# Patient Record
Sex: Female | Born: 1995 | Race: Black or African American | Hispanic: No | Marital: Married | Smoking: Never smoker
Health system: Southern US, Community
[De-identification: ages and names within clinical notes are randomized; demographics above are authoritative.]

## PROBLEM LIST (undated history)

## (undated) DIAGNOSIS — O1490 Unspecified pre-eclampsia, unspecified trimester: Secondary | ICD-10-CM

## (undated) DIAGNOSIS — Z789 Other specified health status: Secondary | ICD-10-CM

## (undated) HISTORY — DX: Other specified health status: Z78.9

## (undated) HISTORY — DX: Unspecified pre-eclampsia, unspecified trimester: O14.90

## (undated) HISTORY — PX: FINGER SURGERY: SHX640

---
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
## 2019-01-24 ENCOUNTER — Other Ambulatory Visit: Payer: Self-pay

## 2019-01-24 ENCOUNTER — Emergency Department
Admission: EM | Admit: 2019-01-24 | Discharge: 2019-01-25 | Disposition: A | Discharge status: Home / Self Care | Payer: BLUE CROSS/BLUE SHIELD | Attending: Emergency Medicine | Admitting: Emergency Medicine

## 2019-01-24 DIAGNOSIS — G43809 Other migraine, not intractable, without status migrainosus: Secondary | ICD-10-CM | POA: Insufficient documentation

## 2019-01-24 NOTE — ED Triage Notes (Signed)
Patient reports having migraine since yesterday evening.

## 2019-01-24 NOTE — ED Provider Notes (Signed)
[facility] Emergency Department Provider Note   ____________________________________________   First MD Initiated Contact with Patient 01/24/19 2339     (approximate)  I have reviewed the triage vital signs and the nursing notes.   HISTORY  Chief Complaint Migraine    HPI Natalie May is a 23 y.o. female who presents to the ED from home with a chief complaint of migraine headache.  Patient has a history of migraine headaches but has not seen a neurologist since she became an adult.  Complains of left-sided headache to her temple with waxing/waning numbness to her hands.  Symptoms associated with nausea and vomiting.  Reports symptoms are typical of migraine headaches.  Denies recent fever, cough, chest pain, shortness of breath, abdominal pain, diarrhea.  Denies recent travel, trauma or exposure to persons diagnosed with coronavirus.        Past medical history Migraines  There are no active problems to display for this patient.   Prior to Admission medications   Medication Sig Start Date End Date Taking? Authorizing Provider  prochlorperazine (COMPAZINE) 10 MG tablet Take 1 tablet (10 mg total) by mouth every 6 (six) hours as needed for nausea (headache). 01/25/19   Irean HongSung, Jade J, MD    Allergies Patient has no known allergies.  No family history on file.  Social History Social History   Tobacco Use  . Smoking status: Not on file  Substance Use Topics  . Alcohol use: Not on file  . Drug use: Not on file    Review of Systems  Constitutional: No fever/chills Eyes: No visual changes. ENT: No sore throat. Cardiovascular: Denies chest pain. Respiratory: Denies shortness of breath. Gastrointestinal: No abdominal pain.  No nausea, no vomiting.  No diarrhea.  No constipation. Genitourinary: Negative for dysuria. Musculoskeletal: Negative for back pain. Skin: Negative for rash. Neurological: Positive for headache and hand numbness.  Negative for focal weakness.   ____________________________________________   PHYSICAL EXAM:  VITAL SIGNS: ED Triage Vitals  Enc Vitals Group     BP 01/24/19 2335 121/72     Pulse Rate 01/24/19 2335 74     Resp 01/24/19 2335 18     Temp 01/24/19 2335 98.3 F (36.8 C)     Temp Source 01/24/19 2335 Oral     SpO2 01/24/19 2335 97 %     Weight 01/24/19 2333 270 lb (122.5 kg)     Height 01/24/19 2333 5\' 6"  (1.676 m)     Head Circumference --      Peak Flow --      Pain Score 01/24/19 2332 6     Pain Loc --      Pain Edu? --      Excl. in GC? --     Constitutional: Alert and oriented. Well appearing and in mild acute distress. Eyes: Conjunctivae are normal. PERRL. EOMI. Head: Atraumatic. Nose: No congestion/rhinnorhea. Mouth/Throat: Mucous membranes are moist.  Oropharynx non-erythematous. Neck: No stridor.  No carotid bruits.  Supple neck without meningismus. Cardiovascular: Normal rate, regular rhythm. Grossly normal heart sounds.  Good peripheral circulation. Respiratory: Normal respiratory effort.  No retractions. Lungs CTAB. Gastrointestinal: Soft and nontender. No distention. No abdominal bruits. No CVA tenderness. Musculoskeletal: No lower extremity tenderness nor edema.  No joint effusions. Neurologic: Alert and oriented x3.  CN II-XII grossly intact. Normal speech and language. No gross focal neurologic deficits are appreciated. No gait instability. Skin:  Skin is warm, dry and intact. No rash noted.  No petechiae.  Psychiatric: Mood and affect are normal. Speech and behavior are normal.  ____________________________________________   LABS (all labs ordered are listed, but only abnormal results are displayed)  Labs Reviewed - No data to display ____________________________________________  EKG  None ____________________________________________  RADIOLOGY  ED MD interpretation: No ICH  Official radiology report(s): Ct Head Wo Contrast  Result Date:  01/25/2019 CLINICAL DATA:  Headaches EXAM: CT HEAD WITHOUT CONTRAST TECHNIQUE: Contiguous axial images were obtained from the base of the skull through the vertex without intravenous contrast. COMPARISON:  None. FINDINGS: Brain: No evidence of acute infarction, hemorrhage, hydrocephalus, extra-axial collection or mass lesion/mass effect. Vascular: No hyperdense vessel or unexpected calcification. Skull: Normal. Negative for fracture or focal lesion. Sinuses/Orbits: No acute finding. Other: None. IMPRESSION: No acute intracranial abnormality noted. Electronically Signed   By: Alcide Clever M.D.   On: 01/25/2019 00:55    ____________________________________________   PROCEDURES  Procedure(s) performed (including Critical Care):  Procedures   ____________________________________________   INITIAL IMPRESSION / ASSESSMENT AND PLAN / ED COURSE  As part of my medical decision making, I reviewed the following data within the electronic MEDICAL RECORD NUMBER Nursing notes reviewed and incorporated, Old chart reviewed, Radiograph reviewed and Notes from prior ED visits        23 year old female who presents with recurrent migraine headache. Differential diagnosis includes, but is not limited to, intracranial hemorrhage, meningitis/encephalitis, previous head trauma, cavernous venous thrombosis, tension headache, temporal arteritis, migraine or migraine equivalent, idiopathic intracranial hypertension, and non-specific headache.  Natalie May was evaluated in Emergency Department on 01/25/2019 for the symptoms described in the history of present illness. She was evaluated in the context of the global COVID-19 pandemic, which necessitated consideration that the patient might be at risk for infection with the SARS-CoV-2 virus that causes COVID-19. Institutional protocols and algorithms that pertain to the evaluation of patients at risk for COVID-19 are in a state of rapid change based on information released  by regulatory bodies including the CDC and federal and state organizations. These policies and algorithms were followed during the patient's care in the ED.  Patient is well-appearing with supple neck and no focal neurological deficits.  Requesting CT head as it has been a number of years since she has had one.  We discussed risk/benefits and she would still like to proceed for reassurance.  Will administer IV Toradol, Compazine, Benadryl and IV fluids and reassess.   Clinical Course as of Jan 25 515  Sun Jan 25, 2019  0131 Updated patient on CT result.  She is feeling significantly better.  Will discharge after completion of IV fluids.  Strict return precautions given.  Patient verbalizes understanding agrees with plan of care.   [JS]    Clinical Course User Index [JS] Irean Hong, MD     ____________________________________________   FINAL CLINICAL IMPRESSION(S) / ED DIAGNOSES  Final diagnoses:  Other migraine without status migrainosus, not intractable     ED Discharge Orders         Ordered    prochlorperazine (COMPAZINE) 10 MG tablet  Every 6 hours PRN     01/25/19 0133           Note:  This document was prepared using Dragon voice recognition software and may include unintentional dictation errors.   Irean Hong, MD 01/25/19 [phone]

## 2019-01-25 ENCOUNTER — Emergency Department: Payer: BLUE CROSS/BLUE SHIELD

## 2019-01-25 MED ORDER — PROCHLORPERAZINE MALEATE 10 MG PO TABS: 10.0000 mg | ORAL_TABLET | Freq: Four times a day (QID) | ORAL | 0 refills | Status: DC | PRN

## 2019-01-25 MED ORDER — DEXTROSE-NACL 5-0.45 % IV SOLN
Freq: Once | INTRAVENOUS | Status: AC
  Administered 2019-01-25: 01:00:00 via INTRAVENOUS

## 2019-01-25 MED ORDER — PROCHLORPERAZINE EDISYLATE 10 MG/2ML IJ SOLN
5.0000 mg | Freq: Once | INTRAMUSCULAR | Status: AC
  Administered 2019-01-25: 5 mg via INTRAVENOUS
  Filled 2019-01-25: qty 2

## 2019-01-25 MED ORDER — KETOROLAC TROMETHAMINE 30 MG/ML IJ SOLN
15.0000 mg | Freq: Once | INTRAMUSCULAR | Status: AC
  Administered 2019-01-25: 01:00:00 15 mg via INTRAVENOUS
  Filled 2019-01-25: qty 1

## 2019-01-25 MED ORDER — DIPHENHYDRAMINE HCL 50 MG/ML IJ SOLN
25.0000 mg | Freq: Once | INTRAMUSCULAR | Status: AC
  Administered 2019-01-25: 25 mg via INTRAVENOUS
  Filled 2019-01-25: qty 1

## 2019-01-25 NOTE — Discharge Instructions (Signed)
Take Compazine (#20) as needed for headache and nausea. Return to the ER for worsening symptoms, persistent vomiting, difficulty breathing or other concerns.

## 2019-01-25 NOTE — ED Notes (Signed)
Reviewed discharge instructions, follow-up care, and prescriptions with patient. Patient verbalized understanding of all information reviewed. Patient stable, with no distress noted at this time.    

## 2019-01-25 NOTE — ED Notes (Signed)
Patient c/o migraine headache beginning yesterday. Patient reports hx of complex migraines; reports seeing neurologist as a child for same. Patient has tried OTC medications with no relief. Patient reports current nausea, and 3 earlier emeses. Patient c/o light sensitivity and intermittent visual disturbance in the right eye.

## 2021-04-10 ENCOUNTER — Emergency Department
Admission: EM | Admit: 2021-04-10 | Discharge: 2021-04-10 | Disposition: A | Discharge status: Home / Self Care | Payer: 59 | Attending: Emergency Medicine | Admitting: Emergency Medicine

## 2021-04-10 ENCOUNTER — Other Ambulatory Visit: Payer: Self-pay

## 2021-04-10 DIAGNOSIS — U071 COVID-19: Secondary | ICD-10-CM | POA: Insufficient documentation

## 2021-04-10 DIAGNOSIS — R519 Headache, unspecified: Secondary | ICD-10-CM | POA: Diagnosis present

## 2021-04-10 LAB — RESP PANEL BY RT-PCR (FLU A&B, COVID) ARPGX2
Influenza A by PCR: NEGATIVE
Influenza B by PCR: NEGATIVE
SARS Coronavirus 2 by RT PCR: POSITIVE — AB

## 2021-04-10 MED ORDER — ONDANSETRON 4 MG PO TBDP
4.0000 mg | ORAL_TABLET | Freq: Once | ORAL | Status: AC
  Administered 2021-04-10: 4 mg via ORAL
  Filled 2021-04-10: qty 1

## 2021-04-10 MED ORDER — ONDANSETRON 4 MG PO TBDP: 4.0000 mg | ORAL_TABLET | Freq: Three times a day (TID) | ORAL | 0 refills | Status: DC | PRN

## 2021-04-10 MED ORDER — NIRMATRELVIR/RITONAVIR (PAXLOVID)TABLET: 3.0000 | ORAL_TABLET | Freq: Two times a day (BID) | ORAL | 0 refills | Status: AC

## 2021-04-10 MED ORDER — IBUPROFEN 600 MG PO TABS
600.0000 mg | ORAL_TABLET | Freq: Once | ORAL | Status: AC
  Administered 2021-04-10: 600 mg via ORAL
  Filled 2021-04-10: qty 1

## 2021-04-10 NOTE — ED Provider Notes (Signed)
[facility] Emergency Department Provider Note  ____________________________________________  Time seen: Approximately 6:42 AM  I have reviewed the triage vital signs and the nursing notes.   HISTORY  Chief Complaint Headache   HPI Natalie May is a 25 y.o. female with history of obesity who presents for evaluation of headache and body aches.  Patient reports that her symptoms started today.  She is complaining of generalized body aches, chills, headache and nausea.  Denies cough, chest pain, shortness of breath, vomiting, diarrhea.  Patient reports that she is vaccinated and has a booster for COVID.  Denies any known exposures.  PMH None - reviewed   Prior to Admission medications   Medication Sig Start Date End Date Taking? Authorizing Provider  nirmatrelvir/ritonavir EUA (PAXLOVID) TABS Take 3 tablets by mouth 2 (two) times daily for 5 days. Patient GFR is normal. Take nirmatrelvir (150 mg) two tablets twice daily for 5 days and ritonavir (100 mg) one tablet twice daily for 5 days. 04/10/21 04/15/21 Yes Esty Ahuja, [place], MD  ondansetron (ZOFRAN ODT) 4 MG disintegrating tablet Take 1 tablet (4 mg total) by mouth every 8 (eight) hours as needed. 04/10/21  Yes Don Perking, [place], MD  prochlorperazine (COMPAZINE) 10 MG tablet Take 1 tablet (10 mg total) by mouth every 6 (six) hours as needed for nausea (headache). 01/25/19   Irean Hong, MD    Allergies Patient has no known allergies.  No family history on file.  Social History    Review of Systems  Constitutional: Negative for fever. + chills, body aches Eyes: Negative for visual changes. ENT: Negative for sore throat. Neck: No neck pain  Cardiovascular: Negative for chest pain. Respiratory: Negative for shortness of breath. Gastrointestinal: Negative for abdominal pain, vomiting or diarrhea. + nausea Genitourinary: Negative for dysuria. Musculoskeletal: Negative for back pain. Skin: Negative  for rash. Neurological: Negative for weakness or numbness. + HA Psych: No SI or HI  ____________________________________________   PHYSICAL EXAM:  VITAL SIGNS: ED Triage Vitals  Enc Vitals Group     BP 04/10/21 0027 123/69     Pulse Rate 04/10/21 0027 (!) 106     Resp 04/10/21 0027 18     Temp 04/10/21 0027 99.7 F (37.6 C)     Temp Source 04/10/21 0027 Oral     SpO2 04/10/21 0027 100 %     Weight 04/10/21 0028 270 lb (122.5 kg)     Height 04/10/21 0028 5\' 6"  (1.676 m)     Head Circumference --      Peak Flow --      Pain Score 04/10/21 0028 7     Pain Loc --      Pain Edu? --      Excl. in GC? --     Constitutional: Alert and oriented. Well appearing and in no apparent distress. HEENT:      Head: Normocephalic and atraumatic.         Eyes: Conjunctivae are normal. Sclera is non-icteric.       Mouth/Throat: Mucous membranes are moist.       Neck: Supple with no signs of meningismus. Cardiovascular: Tachycardic with regular rhythm. Respiratory: Normal respiratory effort. Lungs are clear to auscultation bilaterally.  Gastrointestinal: Soft, non tender, and non distended with positive bowel sounds. No rebound or guarding. Genitourinary: No CVA tenderness. Musculoskeletal:  No edema, cyanosis, or erythema of extremities. Neurologic: Normal speech and language. Face is symmetric. Moving all extremities. No gross focal neurologic deficits are appreciated.  Skin: Skin is warm, dry and intact. No rash noted. Psychiatric: Mood and affect are normal. Speech and behavior are normal.  ____________________________________________   LABS (all labs ordered are listed, but only abnormal results are displayed)  Labs Reviewed  RESP PANEL BY RT-PCR (FLU A&B, COVID) ARPGX2 - Abnormal; Notable for the following components:      Result Value   SARS Coronavirus 2 by RT PCR POSITIVE (*)    All other components within normal limits    ____________________________________________  EKG  none  ____________________________________________  RADIOLOGY  none  ____________________________________________   PROCEDURES  Procedure(s) performed: None Procedures Critical Care performed:  None ____________________________________________   INITIAL IMPRESSION / ASSESSMENT AND PLAN / ED COURSE  25 y.o. female with history of obesity who presents for evaluation of headache, chills, body aches, and nausea.  Symptoms started this morning.  Patient is well-appearing in no distress, normal work of breathing and normal sats both in ambulation and at rest.  Has no chest pain or shortness of breath.  COVID positive.  Since patient has a history of obesity with a BMI of 44 we did discuss risks and benefits of Paxlovid.  Patient little hesitant in starting the medication but a prescription was given anyways.  She was also receiving a prescription of Zofran.  Discussed quarantine, immune system boosting, oxygen monitoring at home, follow-up with PCP, and my standard return precautions      _____________________________________________ Please note:  Patient was evaluated in Emergency Department today for the symptoms described in the history of present illness. Patient was evaluated in the context of the global COVID-19 pandemic, which necessitated consideration that the patient might be at risk for infection with the SARS-CoV-2 virus that causes COVID-19. Institutional protocols and algorithms that pertain to the evaluation of patients at risk for COVID-19 are in a state of rapid change based on information released by regulatory bodies including the CDC and federal and state organizations. These policies and algorithms were followed during the patient's care in the ED.  Some ED evaluations and interventions may be delayed as a result of limited staffing during the pandemic.   [place] Controlled Substance Database was reviewed by  me. ____________________________________________   FINAL CLINICAL IMPRESSION(S) / ED DIAGNOSES   Final diagnoses:  COVID-19      NEW MEDICATIONS STARTED DURING THIS VISIT:  ED Discharge Orders          Ordered    nirmatrelvir/ritonavir EUA (PAXLOVID) TABS  2 times daily        04/10/21 0641    ondansetron (ZOFRAN ODT) 4 MG disintegrating tablet  Every 8 hours PRN        04/10/21 0641             Note:  This document was prepared using Dragon voice recognition software and may include unintentional dictation errors.    Don Perking, [place], MD 04/10/21 [phone]

## 2021-04-10 NOTE — Discharge Instructions (Addendum)
To help boost your immune system against COVID-19, please take:  - Vitamin D3 4,000 IU/day - Vitamin C 500-1,000?mg twice a day - Quercetin 250?mg twice a day - Zinc 100?mg/day - Melatonin 10?mg before bedtime (causes drowsiness) - Aspirin 325?mg/day (unless contraindicated) - Pulse Oximeter Monitoring of oxygen saturation is recommended - check your oxygen 3 times a day if less than 90% return to the ER  These medications are all over-the-counter and do not need a prescription.  QUARANTINE INSTRUCTION  Follow these instructions at home:  Protecting others To avoid spreading the illness to other people: Quarantine in your home until you have had no cough and fever for 7 days. Household members should also be quarantine for at least 14 days after being exposed to you. Wash your hands often with soap and water. If soap and water are not available, use an alcohol-based hand sanitizer. If you have not cleaned your hands, do not touch your face. Make sure that all people in your household wash their hands well and often. Cover your nose and mouth when you cough or sneeze. Throw away used tissues. Stay home if you have any cold-like or flu-like symptoms. General instructions Go to your local pharmacy and buy a pulse oximeter (this is a machine that measures your oxygen). Check your oxygen levels at least 3 times a day. If your oxygen level is 90% or less return to the emergency room immediately Take over-the-counter and prescription medicines only as told by your health care provider. If you need medication for fever take tylenol or ibuprofen Drink enough fluid to keep your urine pale yellow. Rest at home as directed by your health care provider. Do not give aspirin to a child with the flu, because of the association with Reye's syndrome. Do not use tobacco products, including cigarettes, chewing tobacco, and e-cigarettes. If you need help quitting, ask your health care provider. Keep all  follow-up visits as told by your health care provider. This is important. How is this prevented? Avoid areas where an outbreak has been reported. Avoid large groups of people. Keep a safe distance from people who are coughing and sneezing. Do not touch your face if you have not cleaned your hands. When you are around people who are sick or might be sick, wear a mask to protect yourself. Contact a health care provider if: You have symptoms of SARS (cough, fever, chest pain, shortness of breath) that are not getting better at home. You have a fever. If you have difficulty breathing go to your local ER or call 911   

## 2021-04-10 NOTE — ED Triage Notes (Signed)
Pt states headache today with nausea. Pt states she took tylenol at 1900. Pt states she has had chills, no known covid exposure. Pt denies cough. Pt appears in no acute distress.

## 2021-06-21 ENCOUNTER — Encounter: Payer: Self-pay | Admitting: Advanced Practice Midwife

## 2021-06-21 ENCOUNTER — Other Ambulatory Visit (HOSPITAL_COMMUNITY)
Admission: RE | Admit: 2021-06-21 | Discharge: 2021-06-21 | Disposition: A | Discharge status: Home / Self Care | Payer: 59 | Source: Ambulatory Visit | Attending: Advanced Practice Midwife | Admitting: Advanced Practice Midwife

## 2021-06-21 ENCOUNTER — Ambulatory Visit (INDEPENDENT_AMBULATORY_CARE_PROVIDER_SITE_OTHER): Payer: 59 | Admitting: Advanced Practice Midwife

## 2021-06-21 ENCOUNTER — Other Ambulatory Visit: Payer: Self-pay

## 2021-06-21 VITALS — BP 120/82 | HR 91 | Ht 66.0 in | Wt 270.0 lb

## 2021-06-21 DIAGNOSIS — Z319 Encounter for procreative management, unspecified: Secondary | ICD-10-CM | POA: Diagnosis not present

## 2021-06-21 DIAGNOSIS — Z01419 Encounter for gynecological examination (general) (routine) without abnormal findings: Secondary | ICD-10-CM | POA: Insufficient documentation

## 2021-06-21 NOTE — Progress Notes (Signed)
Pt request STI testing.

## 2021-06-21 NOTE — Patient Instructions (Signed)
[facility] [address] [place] [phone]

## 2021-06-22 DIAGNOSIS — O99210 Obesity complicating pregnancy, unspecified trimester: Secondary | ICD-10-CM | POA: Insufficient documentation

## 2021-06-22 DIAGNOSIS — Z01419 Encounter for gynecological examination (general) (routine) without abnormal findings: Secondary | ICD-10-CM | POA: Insufficient documentation

## 2021-06-22 DIAGNOSIS — E669 Obesity, unspecified: Secondary | ICD-10-CM | POA: Insufficient documentation

## 2021-06-22 DIAGNOSIS — Z319 Encounter for procreative management, unspecified: Secondary | ICD-10-CM | POA: Insufficient documentation

## 2021-06-22 LAB — HEPATITIS C ANTIBODY: Hep C Virus Ab: <0.1 s/co ratio (ref 0.0–0.9)

## 2021-06-22 LAB — HIV ANTIBODY (ROUTINE TESTING W REFLEX): HIV Screen 4th Generation wRfx: NONREACTIVE

## 2021-06-22 LAB — RPR: RPR Ser Ql: NONREACTIVE

## 2021-06-22 LAB — HEPATITIS B SURFACE ANTIGEN: Hepatitis B Surface Ag: NEGATIVE

## 2021-06-22 NOTE — Progress Notes (Signed)
GYNECOLOGY ANNUAL PREVENTATIVE CARE ENCOUNTER NOTE  History:     Natalie May is a 25 y.o. G0P0000 female here for a routine annual gynecologic exam.  Current complaints: None.  Denies abnormal vaginal bleeding, discharge, pelvic pain, problems with intercourse or other gynecologic concerns.   Patient is accompanied by her partner RJ. They are planning pregnancy in the near future "not trying but not preventing" pregnancy at this time.   Gynecologic History Patient's last menstrual period was 05/26/2021. Contraception: none Last Pap: 2020. Result was normal with negative HPV Last Mammogram: N/A age 29  Obstetric History OB History  Gravida Para Term Preterm AB Living  0 0 0 0 0 0  SAB IAB Ectopic Multiple Live Births  0 0 0 0 0    History reviewed. No pertinent past medical history.  Past Surgical History:  Procedure Laterality Date   FINGER SURGERY      Current Outpatient Medications on File Prior to Visit  Medication Sig Dispense Refill   ondansetron (ZOFRAN ODT) 4 MG disintegrating tablet Take 1 tablet (4 mg total) by mouth every 8 (eight) hours as needed. (Patient not taking: Reported on 06/21/2021) 20 tablet 0   prochlorperazine (COMPAZINE) 10 MG tablet Take 1 tablet (10 mg total) by mouth every 6 (six) hours as needed for nausea (headache). (Patient not taking: Reported on 06/21/2021) 20 tablet 0   No current facility-administered medications on file prior to visit.    No Known Allergies  Social History:  reports that she has never smoked. She has never used smokeless tobacco. She reports current alcohol use. She reports that she does not use drugs.  Family History  Problem Relation Age of Onset   Diabetes Maternal Grandmother    Breast cancer Maternal Great-grandmother     The following portions of the patient's history were reviewed and updated as appropriate: allergies, current medications, past family history, past medical history, past social history,  past surgical history and problem list.  Review of Systems Pertinent items noted in HPI and remainder of comprehensive ROS otherwise negative.  Physical Exam:  BP 120/82   Pulse 91   Ht 5\' 6"  (1.676 m)   Wt 270 lb (122.5 kg)   LMP 05/26/2021   BMI 43.58 kg/m  CONSTITUTIONAL: Well-developed, well-nourished female in no acute distress.  HENT:  Normocephalic, atraumatic, External right and left ear normal.  EYES: Conjunctivae and EOM are normal. Pupils are equal, round, and reactive to light. No scleral icterus.  NECK: Normal range of motion, supple, no masses.  Normal thyroid.  SKIN: Skin is warm and dry. No rash noted. Not diaphoretic. No erythema. No pallor. MUSCULOSKELETAL: Normal range of motion. No tenderness.  No cyanosis, clubbing, or edema. NEUROLOGIC: Alert and oriented to person, place, and time. Normal reflexes, muscle tone coordination.  PSYCHIATRIC: Normal mood and affect. Normal behavior. Normal judgment and thought content. CARDIOVASCULAR: Normal heart rate noted, regular rhythm RESPIRATORY: Clear to auscultation bilaterally. Effort and breath sounds normal, no problems with respiration noted. BREASTS: Symmetric in size. No masses, tenderness, skin changes, nipple drainage, or lymphadenopathy bilaterally. Performed in the presence of a chaperone. ABDOMEN: Soft, no distention noted.  No tenderness, rebound or guarding.  PELVIC: Normal appearing external genitalia and urethral meatus; normal appearing vaginal mucosa and cervix.  No abnormal vaginal discharge noted.  Pap smear obtained.  Normal uterine size, no other palpable masses, no uterine or adnexal tenderness.  Performed in the presence of a chaperone.   Assessment and Plan:  1. Well woman exam with routine gynecological exam - Normal physical exam - Cytology - PAP( [place]) - HIV antibody (with reflex) - Hepatitis C Antibody - Hepatitis B Surface AntiGEN - RPR  2. Patient desires pregnancy - Encouraged to  prioritize personal health while trying to conceive - Start daily prenatal vitamin  with folic acid 3 months prior to focused attempts to conceive  Needs PCP. Given contact information for [facility]  Will follow up results of pap smear and manage accordingly. Routine preventative health maintenance measures emphasized. Please refer to After Visit Summary for other counseling recommendations.     Total visit time 30 minutes. Greater than 50% of visit spent in counseling and coordination of care  Clayton Bibles, MSN, CNM Certified Nurse Midwife, [employer] for [employer], [facility]

## 2021-06-28 LAB — CYTOLOGY - PAP
Chlamydia: NEGATIVE
Comment: NEGATIVE
Comment: NEGATIVE
Comment: NORMAL
Diagnosis: NEGATIVE
Neisseria Gonorrhea: NEGATIVE
Trichomonas: NEGATIVE

## 2021-11-30 ENCOUNTER — Other Ambulatory Visit: Payer: Self-pay

## 2021-11-30 ENCOUNTER — Ambulatory Visit (INDEPENDENT_AMBULATORY_CARE_PROVIDER_SITE_OTHER): Payer: BC Managed Care – PPO | Admitting: Advanced Practice Midwife

## 2021-11-30 VITALS — BP 105/74 | HR 85 | Wt 287.0 lb

## 2021-11-30 DIAGNOSIS — Z01419 Encounter for gynecological examination (general) (routine) without abnormal findings: Secondary | ICD-10-CM | POA: Diagnosis not present

## 2021-11-30 DIAGNOSIS — Z3189 Encounter for other procreative management: Secondary | ICD-10-CM | POA: Diagnosis not present

## 2021-11-30 DIAGNOSIS — Z319 Encounter for procreative management, unspecified: Secondary | ICD-10-CM

## 2021-11-30 DIAGNOSIS — Z0000 Encounter for general adult medical examination without abnormal findings: Secondary | ICD-10-CM

## 2021-11-30 NOTE — Progress Notes (Signed)
° ° °  GYNECOLOGY PROBLEM CARE ENCOUNTER NOTE  History:     Natalie May is a 26 y.o. G0P0000 female here for a routine annual gynecologic exam.  Current complaints: patient requests "fertility evaluation".  She and her partner RJ desire pregnancy. She is intermittently tracking her menstrual cycle using a combination of the Flo app and her paper calendar. She is not tracking ovulation and they are not coordinating sexual intercourse. She is concerned she may not be able to become pregnant. Denies abnormal vaginal bleeding, discharge, pelvic pain, problems with intercourse or other gynecologic concerns.    Gynecologic History Patient's last menstrual period was 11/27/2021 (exact date). Contraception: none Last Pap: 06/21/2021. Result was normal with negative HPV   Obstetric History OB History  Gravida Para Term Preterm AB Living  0 0 0 0 0 0  SAB IAB Ectopic Multiple Live Births  0 0 0 0 0    No past medical history on file.  Past Surgical History:  Procedure Laterality Date   FINGER SURGERY      Current Outpatient Medications on File Prior to Visit  Medication Sig Dispense Refill   prochlorperazine (COMPAZINE) 10 MG tablet Take 1 tablet (10 mg total) by mouth every 6 (six) hours as needed for nausea (headache). (Patient not taking: Reported on 06/21/2021) 20 tablet 0   No current facility-administered medications on file prior to visit.    No Known Allergies  Social History:  reports that she has never smoked. She has never used smokeless tobacco. She reports current alcohol use. She reports that she does not use drugs.  Family History  Problem Relation Age of Onset   Diabetes Maternal Grandmother    Breast cancer Maternal Great-grandmother     The following portions of the patient's history were reviewed and updated as appropriate: allergies, current medications, past family history, past medical history, past social history, past surgical history and problem  list.  Review of Systems Pertinent items noted in HPI and remainder of comprehensive ROS otherwise negative.  Physical Exam:  BP 105/74    Pulse 85    Wt 287 lb (130.2 kg)    LMP 11/27/2021 (Exact Date)    BMI 46.32 kg/m  CONSTITUTIONAL: Well-developed, well-nourished female in no acute distress.  NEUROLOGIC: Alert and oriented to person, place, and time. Normal reflexes, muscle tone coordination.  PSYCHIATRIC: Normal mood and affect. Normal behavior. Normal judgment and thought content. CARDIOVASCULAR: Normal heart rate noted   Assessment and Plan:    1. Encounter for fertility planning - Encouraged patient to focus on overall wellness, baseline health assessment - CBC - Hemoglobin A1c - TSH - Lipid panel - Comprehensive metabolic panel - VITAMIN D 25 Hydroxy (Vit-D Deficiency, Fractures)  2. Patient desires pregnancy - Reviewed Up To Date guidelines for diagnosis of infertility - Encouraged patient and partner to restart use of Flo app, track ovulation, schedule sex accordingly, reassess PRN   Routine preventative health maintenance measures emphasized. Please refer to After Visit Summary for other counseling recommendations.     Clayton Bibles, MSA, MSN, CNM Certified Nurse Midwife, Biochemist, clinical for [employer], [facility]

## 2021-12-01 LAB — COMPREHENSIVE METABOLIC PANEL
ALT: 10 IU/L (ref 0–32)
AST: 14 IU/L (ref 0–40)
Albumin/Globulin Ratio: 1.3 (ref 1.2–2.2)
Albumin: 4 g/dL (ref 3.9–5.0)
Alkaline Phosphatase: 94 IU/L (ref 44–121)
BUN/Creatinine Ratio: 8 — ABNORMAL LOW (ref 9–23)
BUN: 5 mg/dL — ABNORMAL LOW (ref 6–20)
Bilirubin Total: 0.3 mg/dL (ref 0.0–1.2)
CO2: 24 mmol/L (ref 20–29)
Calcium: 9.1 mg/dL (ref 8.7–10.2)
Chloride: 102 mmol/L (ref 96–106)
Creatinine, Ser: 0.66 mg/dL (ref 0.57–1.00)
Globulin, Total: 3.1 g/dL (ref 1.5–4.5)
Glucose: 87 mg/dL (ref 70–99)
Potassium: 4.9 mmol/L (ref 3.5–5.2)
Sodium: 137 mmol/L (ref 134–144)
Total Protein: 7.1 g/dL (ref 6.0–8.5)
eGFR: 124 mL/min/{1.73_m2} (ref >59)

## 2021-12-01 LAB — CBC
Hematocrit: 40.7 % (ref 34.0–46.6)
Hemoglobin: 13.1 g/dL (ref 11.1–15.9)
MCH: 27.5 pg (ref 26.6–33.0)
MCHC: 32.2 g/dL (ref 31.5–35.7)
MCV: 85 fL (ref 79–97)
Platelets: 330 10*3/uL (ref 150–450)
RBC: 4.77 x10E6/uL (ref 3.77–5.28)
RDW: 12.9 % (ref 11.7–15.4)
WBC: 3.4 10*3/uL (ref 3.4–10.8)

## 2021-12-01 LAB — LIPID PANEL
Chol/HDL Ratio: 3.7 ratio (ref 0.0–4.4)
Cholesterol, Total: 212 mg/dL — ABNORMAL HIGH (ref 100–199)
HDL: 57 mg/dL (ref >39)
LDL Chol Calc (NIH): 141 mg/dL — ABNORMAL HIGH (ref 0–99)
Triglycerides: 76 mg/dL (ref 0–149)
VLDL Cholesterol Cal: 14 mg/dL (ref 5–40)

## 2021-12-01 LAB — TSH: TSH: 0.786 u[IU]/mL (ref 0.450–4.500)

## 2021-12-01 LAB — HEMOGLOBIN A1C
Est. average glucose Bld gHb Est-mCnc: 108 mg/dL
Hgb A1c MFr Bld: 5.4 % (ref 4.8–5.6)

## 2021-12-01 LAB — VITAMIN D 25 HYDROXY (VIT D DEFICIENCY, FRACTURES): Vit D, 25-Hydroxy: 22.9 ng/mL — ABNORMAL LOW (ref 30.0–100.0)

## 2021-12-01 MED ORDER — VITAMIN D3 20 MCG (800 UNIT) PO TABS: 1.0000 | ORAL_TABLET | Freq: Every day | ORAL | 5 refills | Status: AC

## 2021-12-01 NOTE — Addendum Note (Signed)
Addended by: Calvert Cantor on: 12/01/2021 09:41 PM   Modules accepted: Orders

## 2022-01-04 ENCOUNTER — Telehealth: Payer: Self-pay

## 2022-01-04 NOTE — Telephone Encounter (Signed)
Left message for pt to call the office back regarding her nurse visit on Mon 01/08/22. We need to reschedule for Tuesday.  ?

## 2022-01-08 ENCOUNTER — Ambulatory Visit: Payer: BC Managed Care – PPO

## 2022-01-09 ENCOUNTER — Ambulatory Visit (INDEPENDENT_AMBULATORY_CARE_PROVIDER_SITE_OTHER): Payer: BC Managed Care – PPO | Admitting: *Deleted

## 2022-01-09 VITALS — BP 118/80 | HR 73

## 2022-01-09 DIAGNOSIS — N912 Amenorrhea, unspecified: Secondary | ICD-10-CM

## 2022-01-09 LAB — POCT URINE PREGNANCY: Preg Test, Ur: NEGATIVE

## 2022-01-09 NOTE — Progress Notes (Signed)
[facility] here for a UPT. Pt had a negative upt at home. LMP is 11/27/21.   ? ? ?UPT in office Negative.  ? ? ?Pt was concerned since she hasn't had a cycle since Feb. Advised many things can alter our cycles and she cant take a test once a week until she starts if that gives her some reassurance. Pt verbalizes and understands.   ? ?Scheryl Marten, RN  ?

## 2022-01-16 ENCOUNTER — Ambulatory Visit: Payer: BC Managed Care – PPO

## 2022-08-13 ENCOUNTER — Emergency Department
Admission: EM | Admit: 2022-08-13 | Discharge: 2022-08-13 | Disposition: A | Discharge status: Home / Self Care | Payer: BC Managed Care – PPO | Attending: Emergency Medicine | Admitting: Emergency Medicine

## 2022-08-13 ENCOUNTER — Other Ambulatory Visit: Payer: Self-pay

## 2022-08-13 ENCOUNTER — Emergency Department: Payer: BC Managed Care – PPO

## 2022-08-13 DIAGNOSIS — N9489 Other specified conditions associated with female genital organs and menstrual cycle: Secondary | ICD-10-CM | POA: Diagnosis not present

## 2022-08-13 DIAGNOSIS — R1030 Lower abdominal pain, unspecified: Secondary | ICD-10-CM | POA: Insufficient documentation

## 2022-08-13 DIAGNOSIS — Z3A01 Less than 8 weeks gestation of pregnancy: Secondary | ICD-10-CM | POA: Diagnosis not present

## 2022-08-13 DIAGNOSIS — O26891 Other specified pregnancy related conditions, first trimester: Secondary | ICD-10-CM | POA: Diagnosis present

## 2022-08-13 LAB — COMPREHENSIVE METABOLIC PANEL
ALT: 13 U/L (ref 0–44)
AST: 18 U/L (ref 15–41)
Albumin: 3.4 g/dL — ABNORMAL LOW (ref 3.5–5.0)
Alkaline Phosphatase: 77 U/L (ref 38–126)
Anion gap: 6 (ref 5–15)
BUN: <5 mg/dL — ABNORMAL LOW (ref 6–20)
CO2: 24 mmol/L (ref 22–32)
Calcium: 9.2 mg/dL (ref 8.9–10.3)
Chloride: 108 mmol/L (ref 98–111)
Creatinine, Ser: 0.66 mg/dL (ref 0.44–1.00)
GFR, Estimated: >60 mL/min (ref >60)
Glucose, Bld: 99 mg/dL (ref 70–99)
Potassium: 4.2 mmol/L (ref 3.5–5.1)
Sodium: 138 mmol/L (ref 135–145)
Total Bilirubin: 0.7 mg/dL (ref 0.3–1.2)
Total Protein: 7.2 g/dL (ref 6.5–8.1)

## 2022-08-13 LAB — CBC
HCT: 37.1 % (ref 36.0–46.0)
Hemoglobin: 12 g/dL (ref 12.0–15.0)
MCH: 26.6 pg (ref 26.0–34.0)
MCHC: 32.3 g/dL (ref 30.0–36.0)
MCV: 82.3 fL (ref 80.0–100.0)
Platelets: 294 10*3/uL (ref 150–400)
RBC: 4.51 MIL/uL (ref 3.87–5.11)
RDW: 14.5 % (ref 11.5–15.5)
WBC: 5.9 10*3/uL (ref 4.0–10.5)
nRBC: 0 % (ref 0.0–0.2)

## 2022-08-13 LAB — URINALYSIS, ROUTINE W REFLEX MICROSCOPIC
Bilirubin Urine: NEGATIVE
Glucose, UA: NEGATIVE mg/dL
Ketones, ur: NEGATIVE mg/dL
Leukocytes,Ua: NEGATIVE
Nitrite: NEGATIVE
Protein, ur: NEGATIVE mg/dL
Specific Gravity, Urine: 1.003 — ABNORMAL LOW (ref 1.005–1.030)
pH: 8 (ref 5.0–8.0)

## 2022-08-13 LAB — HCG, QUANTITATIVE, PREGNANCY: hCG, Beta Chain, Quant, S: 42289 m[IU]/mL — ABNORMAL HIGH (ref <5)

## 2022-08-13 LAB — LIPASE, BLOOD: Lipase: 28 U/L (ref 11–51)

## 2022-08-13 LAB — POC URINE PREG, ED: Preg Test, Ur: POSITIVE — AB

## 2022-08-13 MED ORDER — ACETAMINOPHEN 500 MG PO TABS
1000.0000 mg | ORAL_TABLET | Freq: Once | ORAL | Status: AC
  Administered 2022-08-13: 1000 mg via ORAL
  Filled 2022-08-13: qty 2

## 2022-08-13 MED ORDER — ALUM & MAG HYDROXIDE-SIMETH 200-200-20 MG/5ML PO SUSP
30.0000 mL | Freq: Once | ORAL | Status: AC
  Administered 2022-08-13: 30 mL via ORAL
  Filled 2022-08-13: qty 30

## 2022-08-13 NOTE — ED Provider Notes (Signed)
[facility] Provider Note    Event Date/Time   First MD Initiated Contact with Patient 08/13/22 0930     (approximate)   History   Abdominal Cramping   HPI  Runa Whittingham is a 26 y.o. female G1, P0 at approximate [redacted] weeks pregnant who presents with abdominal pain.  Patient's LMP was 9/25.  Found out she was pregnant several weeks ago.  She endorses pain in the mid to lower abdomen does not localize to either side.  Rather constant.  She also has intermittent shooting pain that she feels in the vaginal region.  Denies any pain with urination urgency frequency.  No bleeding.  Has had nausea but no vomiting.  Has had loose stools since finding out she is pregnant.  Did take Unisom last night.  Tried to schedule appoint with OB/GYN but has not been able to yet.  This is her first pregnancy.     History reviewed. No pertinent past medical history.  Patient Active Problem List   Diagnosis Date Noted   Well woman exam with routine gynecological exam 06/22/2021   Patient desires pregnancy 06/22/2021   Obesity 06/22/2021     Physical Exam  Triage Vital Signs: ED Triage Vitals  Enc Vitals Group     BP 08/13/22 0913 102/69     Pulse Rate 08/13/22 0913 86     Resp 08/13/22 0913 18     Temp 08/13/22 0913 98.8 F (37.1 C)     Temp Source 08/13/22 0913 Oral     SpO2 08/13/22 0913 100 %     Weight 08/13/22 0914 270 lb (122.5 kg)     Height 08/13/22 0914 5\' 6"  (1.676 m)     Head Circumference --      Peak Flow --      Pain Score 08/13/22 0914 5     Pain Loc --      Pain Edu? --      Excl. in [place]? --     Most recent vital signs: Vitals:   08/13/22 0913  BP: 102/69  Pulse: 86  Resp: 18  Temp: 98.8 F (37.1 C)  SpO2: 100%     General: Awake, no distress.  CV:  Good peripheral perfusion.  Resp:  Normal effort.  Abd:  No distention.  Neuro:             Awake, Alert, Oriented x 3  Other:  Mild suprapubic tenderness to palpation, no significant  right lower quadrant or left lower quadrant tenderness, mild epigastric tenderness as well   ED Results / Procedures / Treatments  Labs (all labs ordered are listed, but only abnormal results are displayed) Labs Reviewed  COMPREHENSIVE METABOLIC PANEL - Abnormal; Notable for the following components:      Result Value   BUN <5 (*)    Albumin 3.4 (*)    All other components within normal limits  URINALYSIS, ROUTINE W REFLEX MICROSCOPIC - Abnormal; Notable for the following components:   Color, Urine STRAW (*)    APPearance CLEAR (*)    Specific Gravity, Urine 1.003 (*)    Hgb urine dipstick SMALL (*)    Bacteria, UA RARE (*)    All other components within normal limits  HCG, QUANTITATIVE, PREGNANCY - Abnormal; Notable for the following components:   hCG, Beta Chain, Quant, S 42,289 (*)    All other components within normal limits  POC URINE PREG, ED - Abnormal; Notable for the following components:  Preg Test, Ur POSITIVE (*)    All other components within normal limits  LIPASE, BLOOD  CBC     EKG     RADIOLOGY  Reviewed and interpreted the pelvic ultrasound which shows a normal intrauterine pregnancy with subchorionic hemorrhage  PROCEDURES:  Critical Care performed: No  Procedures    MEDICATIONS ORDERED IN ED: Medications  acetaminophen (TYLENOL) tablet 1,000 mg (1,000 mg Oral Given 08/13/22 0951)  alum & mag hydroxide-simeth (MAALOX/MYLANTA) 200-200-20 MG/5ML suspension 30 mL (30 mLs Oral Given 08/13/22 0951)     IMPRESSION / MDM / ASSESSMENT AND PLAN / ED COURSE  I reviewed the triage vital signs and the nursing notes.                              Patient's presentation is most consistent with acute presentation with potential threat to life or bodily function.  Differential diagnosis includes, but is not limited to, ectopic pregnancy, reflux, round ligament pain, UTI  Patient is a 26 year old female G1 P0 about [redacted] weeks pregnant presenting with lower  abdominal pain and nausea.  She said that she is pregnant about 11 days ago and has been having some ongoing abdominal pain since then.  Is primarily in the mid to lower abdomen does not localize to either lower quadrant.  She has no urinary symptoms or vaginal bleeding.  Is a nausea no vomiting no fevers chills.  Her vitals are reassuring overall she looks well abdominal exam is benign but she is tender in the suprapubic region.  I performed a bedside ultrasound to assess for intrauterine pregnancy appears to be a gestational sac but I do not see an obvious yolk sac and patient's habitus limits the exam will obtain formal ultrasound to rule out ectopic confirmed uterine pregnancy.  Labs are currently pending.  My suspicion for other surgical process such as appendicitis or diverticulitis is low  Patient's ultrasound is reassuring there is an intrauterine pregnancy.  There is subchorionic hemorrhage.  Discussed findings with the patient.  UA does not suggest infection.  Suspect that her pain is related to normal for extremis to pregnancy.  She has appoint with GYN on the 21st.  We discussed return precautions for recommended Tylenol heating pad for pain.     FINAL CLINICAL IMPRESSION(S) / ED DIAGNOSES   Final diagnoses:  Abdominal pain during pregnancy in first trimester     Rx / DC Orders   ED Discharge Orders     None        Note:  This document was prepared using Dragon voice recognition software and may include unintentional dictation errors.   Rada Hay, MD 08/13/22 1122

## 2022-08-13 NOTE — ED Triage Notes (Signed)
Pt reports last week found out she was pregnant. Pt is approximately [redacted] weeks pregnant. Pt states she has been having abdominal cramping, nausea and sharp vaginal pain. Pt reports first pregnancy. Pt denies blood thinners.

## 2022-08-13 NOTE — Discharge Instructions (Addendum)
Your ultrasound blood work and urine sample were all reassuring.  You do have a subchorionic hemorrhage which can sometimes cause bleeding in pregnancy.  Please follow-up with the OB/GYN doctors.  You can take Tylenol and use a heating pad for pain.

## 2022-08-28 ENCOUNTER — Encounter: Payer: Self-pay | Admitting: *Deleted

## 2022-08-28 ENCOUNTER — Ambulatory Visit (INDEPENDENT_AMBULATORY_CARE_PROVIDER_SITE_OTHER): Payer: BC Managed Care – PPO | Admitting: *Deleted

## 2022-08-28 VITALS — BP 115/80 | HR 73 | Wt 299.0 lb

## 2022-08-28 DIAGNOSIS — O0990 Supervision of high risk pregnancy, unspecified, unspecified trimester: Secondary | ICD-10-CM | POA: Insufficient documentation

## 2022-08-28 DIAGNOSIS — Z3400 Encounter for supervision of normal first pregnancy, unspecified trimester: Secondary | ICD-10-CM

## 2022-08-28 MED ORDER — DOXYLAMINE-PYRIDOXINE 10-10 MG PO TBEC: 2.0000 | DELAYED_RELEASE_TABLET | Freq: Every day | ORAL | 5 refills | Status: DC

## 2022-08-28 NOTE — Progress Notes (Addendum)
New OB Intake  I explained I am completing New OB Intake today. We discussed her EDD of 04/04/23 that is based on LMP. LMP of 06/28/22. Pt is G1/P0. I reviewed her allergies, medications, Medical/Surgical/OB history, and appropriate screenings.   Patient Active Problem List   Diagnosis Date Noted   Supervision of high risk pregnancy, antepartum 08/28/2022   Obesity 06/22/2021    Concerns addressed today  Delivery Plans:  Plans to deliver at [facility].   MyChart/Babyscripts MyChart access verified. I explained pt will have some visits in office and some virtually. Babyscripts app discussed and ordered.      Anatomy US Explained first scheduled [place] will be around 19 weeks.   Labs Discussed Avelina Laine genetic screening with patient. Would like both Panorama and Horizon drawn at new OB visit. Routine prenatal labs needed.   Placed OB Box on problem list and updated       First visit review I reviewed new OB appt with pt. I explained she will have ob bloodwork with possible genetic screening. Explained pt will be seen by Dr Para March at first visit.    Scheryl Marten, RN 08/28/2022  9:31 AM

## 2022-09-13 ENCOUNTER — Ambulatory Visit (INDEPENDENT_AMBULATORY_CARE_PROVIDER_SITE_OTHER): Payer: BC Managed Care – PPO

## 2022-09-13 VITALS — BP 119/80 | HR 96

## 2022-09-13 DIAGNOSIS — R300 Dysuria: Secondary | ICD-10-CM

## 2022-09-13 LAB — POCT URINALYSIS DIPSTICK
Bilirubin, UA: NEGATIVE
Glucose, UA: NEGATIVE
Spec Grav, UA: >=1.03 — AB (ref 1.010–1.025)

## 2022-09-13 MED ORDER — CEFADROXIL 500 MG PO CAPS: 500.0000 mg | ORAL_CAPSULE | Freq: Two times a day (BID) | ORAL | 0 refills | Status: DC

## 2022-09-13 NOTE — Patient Instructions (Signed)
Safe Medications in Pregnancy   Acne:  Benzoyl Peroxide  Salicylic Acid   Backache/Headache:  Tylenol: 2 regular strength every 4 hours OR               2 Extra strength every 6 hours   Colds/Coughs/Allergies:  Allegra Benadryl (alcohol free) 25 mg every 6 hours as needed  Breath right strips  Claritin  Cepacol throat lozenges  Chloraseptic throat spray  Cold-Eeze- up to three times per day  Cough drops, alcohol free  Flonase Guaifenesin  Mucinex (plain/DM) Nasacort Robitussin DM (plain only, alcohol free)  Saline nasal spray/drops  Steroid nasal sprays Sudafed (pseudoephedrine) & Actifed * use only after [redacted] weeks gestation and if you do not have high blood pressure  Tylenol  Vicks Vaporub  Zinc lozenges  Zyrtec   Make sure to not take anything that has the active ingredient Phenylephrine   Constipation:  Colace  Ducolax suppositories  Fleet enema  Glycerin suppositories  Metamucil  Milk of magnesia  Miralax  Senokot  Smooth move tea   Diarrhea:  Kaopectate  Imodium A-D   *NO pepto Bismol   Hemorrhoids:  Anusol  Anusol HC  Preparation H  Tucks   Indigestion:  Tums  Maalox  Mylanta  Zantac  Pepcid   Insomnia:  Benadryl (alcohol free) 25mg every 6 hours as needed  Tylenol PM  Unisom, no Gelcaps   Leg Cramps:  Tums  MagGel   Nausea/Vomiting:  Bonine  Dramamine  Emetrol  Ginger extract  Sea bands  Meclizine  Nausea medication to take during pregnancy:  Unisom (doxylamine succinate 25 mg tablets) Take one tablet daily at bedtime. If symptoms are not adequately controlled, the dose can be increased to a maximum recommended dose of two tablets daily (1/2 tablet in the morning, 1/2 tablet mid-afternoon and one at bedtime).  Vitamin B6 100mg tablets. Take one tablet twice a day (up to 200 mg per day).   Skin Rashes:  Aveeno products  Benadryl cream or 25mg every 6 hours as needed  Calamine Lotion  1% cortisone cream   Yeast infection:   Gyne-lotrimin 7  Monistat 7    **If taking multiple medications, please check labels to avoid duplicating the same active ingredients  **take medication as directed on the label  ** Do not exceed 4000 mg of tylenol in 24 hours  **Do not take medications that contain aspirin or ibuprofen           

## 2022-09-13 NOTE — Progress Notes (Signed)
SUBJECTIVE: Natalie May is a 26 y.o. female who complains of dysuria onset yesterday.. No fever, chills, or abnormal vaginal discharge or bleeding.   OBJECTIVE: Appears well, in no apparent distress.  Vital signs are normal. Urine dipstick shows positive for RBC's, positive for and positive for leukocytes.    ASSESSMENT: Dysuria  PLAN: Treatment per orders.  Call or return to clinic prn if these symptoms worsen or fail to improve as anticipated.  Consulted w/provider in office Rx sent today for Duricef 500mg  BID x 7 days.

## 2022-09-15 LAB — URINE CULTURE

## 2022-09-17 ENCOUNTER — Encounter: Payer: Self-pay | Admitting: Obstetrics and Gynecology

## 2022-09-25 ENCOUNTER — Ambulatory Visit (INDEPENDENT_AMBULATORY_CARE_PROVIDER_SITE_OTHER): Payer: BC Managed Care – PPO | Admitting: Obstetrics and Gynecology

## 2022-09-25 ENCOUNTER — Encounter: Payer: Self-pay | Admitting: Obstetrics and Gynecology

## 2022-09-25 VITALS — BP 128/81 | HR 87 | Wt 301.0 lb

## 2022-09-25 DIAGNOSIS — Z1332 Encounter for screening for maternal depression: Secondary | ICD-10-CM | POA: Diagnosis not present

## 2022-09-25 DIAGNOSIS — E669 Obesity, unspecified: Secondary | ICD-10-CM

## 2022-09-25 DIAGNOSIS — O0991 Supervision of high risk pregnancy, unspecified, first trimester: Secondary | ICD-10-CM

## 2022-09-25 DIAGNOSIS — O0990 Supervision of high risk pregnancy, unspecified, unspecified trimester: Secondary | ICD-10-CM

## 2022-09-25 DIAGNOSIS — Z23 Encounter for immunization: Secondary | ICD-10-CM | POA: Diagnosis not present

## 2022-09-25 DIAGNOSIS — Z3A12 12 weeks gestation of pregnancy: Secondary | ICD-10-CM | POA: Diagnosis not present

## 2022-09-25 MED ORDER — ASPIRIN 81 MG PO TBEC: 81.0000 mg | DELAYED_RELEASE_TABLET | Freq: Every day | ORAL | 3 refills | Status: DC

## 2022-09-25 NOTE — Addendum Note (Signed)
Addended by: Kennon Portela on: 09/25/2022 04:48 PM   Modules accepted: Orders

## 2022-09-25 NOTE — Progress Notes (Signed)
NOB  NOB Intake done on 08/28/22. Last Pap: 06/21/2021 WNL  Genetic Screening: Desires and wants to know gender.  Flu Vaccine:Will Receive   CC: spotting last week. None since.  Notes blood was dark.  Pain w/ intercourse.  Back pain pt having a hard time sleeping.  Recent urine cx on 09/13/22  was Negative.

## 2022-09-25 NOTE — Progress Notes (Signed)
History:   Natalie May is a 26 y.o. G1P0000 at [redacted]w[redacted]d by LMP, early ultrasound being seen today for her first obstetrical visit.   Patient does intend to breast feed.   No prior pregnancy history.  Patient reports  some nausea but improving. Some discomfort with intercourse and some bleeding but normal FHT today .      HISTORY: OB History  Gravida Para Term Preterm AB Living  1 0 0 0 0 0  SAB IAB Ectopic Multiple Live Births  0 0 0 0 0    # Outcome Date GA Lbr Len/2nd Weight Sex Delivery Anes PTL Lv  1 Current              Last pap smear was done 06/2021 and was normal Lab Results  Component Value Date   DIAGPAP  06/21/2021    - Negative for intraepithelial lesion or malignancy (NILM)     History reviewed. No pertinent past medical history. Past Surgical History:  Procedure Laterality Date   FINGER SURGERY     Family History  Problem Relation Age of Onset   Diabetes Maternal Grandmother    Breast cancer Maternal Great-grandmother    Social History   Tobacco Use   Smoking status: Never   Smokeless tobacco: Never  Vaping Use   Vaping Use: Never used  Substance Use Topics   Alcohol use: Yes   Drug use: Never   No Known Allergies Current Outpatient Medications on File Prior to Visit  Medication Sig Dispense Refill   Doxylamine-Pyridoxine (DICLEGIS) 10-10 MG TBEC Take 2 tablets by mouth at bedtime. If symptoms persist, add one tablet in the morning and one in the afternoon 100 tablet 5   Prenatal Vit-Fe Fumarate-FA (PRENATAL MULTIVITAMIN) TABS tablet Take 1 tablet by mouth daily at 12 noon.     No current facility-administered medications on file prior to visit.    Review of Systems Pertinent items noted in HPI and remainder of comprehensive ROS otherwise negative.  Physical Exam:   Vitals:   09/25/22 1519  BP: 128/81  Pulse: 87  Weight: (!) 301 lb (136.5 kg)   Fetal Heart Rate (bpm): 155   General: well-developed, well-nourished female in no  acute distress  Skin: normal coloration and turgor, no rashes  Neurologic: oriented, normal, negative, normal mood  Extremities: normal strength, tone, and muscle mass, ROM of all joints is normal  HEENT PERRLA, extraocular movement intact and sclera clear, anicteric  Neck supple and no masses  Cardiovascular: regular rate and rhythm  Respiratory:  no respiratory distress, normal breath sounds  Abdomen: soft, non-tender; bowel sounds normal; no masses,  no organomegaly    Assessment:    Pregnancy: G1P0000 Patient Active Problem List   Diagnosis Date Noted   Supervision of high risk pregnancy, antepartum 08/28/2022   Obesity 06/22/2021   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 Supervision of high risk pregnancy, antepartum Initial labs drawn. Continue prenatal vitamins. Problem list reviewed and updated. Genetic Screening discussed, NIPS: ordered. Ultrasound discussed; fetal anatomic survey: ordered. Anticipatory guidance about prenatal visits given including labs, ultrasounds, and testing. Discussed usage of Babyscripts and virtual visits - CBC/D/Plt+RPR+Rh+ABO+RubIgG... - Culture, OB Urine - PANORAMA PRENATAL TEST FULL PANEL - HORIZON CUSTOM - aspirin EC 81 MG tablet; Take 1 tablet (81 mg total) by mouth daily. Swallow whole.  Dispense: 90 tablet; Refill: 3 - HgB A1c  2. Obesity without  serious comorbidity, unspecified classification, unspecified obesity type - Recommended ldASA for G1 and obesity. Pt will start - HgB A1c  3. Flu vaccine need - Flu Vaccine QUAD 36+ mos IM (Fluarix, Quad PF)  The nature of [place] [facility]Faculty Practice with multiple MDs and Advanced Practice Providers was explained to patient; also emphasized that residents, students are part of our team. Routine obstetric precautions reviewed. Encouraged to seek out care at office or emergency room [facility] preferred)  for urgent and/or emergent concerns. Return in about 4 weeks (around 10/23/2022).    Milas Hock, MD, FACOG Obstetrician & Gynecologist, [facility] for [facility], [facility]

## 2022-09-26 LAB — CBC/D/PLT+RPR+RH+ABO+RUBIGG...
Antibody Screen: NEGATIVE
Basophils Absolute: 0 10*3/uL (ref 0.0–0.2)
Basos: 0 %
EOS (ABSOLUTE): 0 10*3/uL (ref 0.0–0.4)
Eos: 0 %
HCV Ab: NONREACTIVE
HIV Screen 4th Generation wRfx: NONREACTIVE
Hematocrit: 35.8 % (ref 34.0–46.6)
Hemoglobin: 12 g/dL (ref 11.1–15.9)
Hepatitis B Surface Ag: NEGATIVE
Immature Grans (Abs): 0 10*3/uL (ref 0.0–0.1)
Immature Granulocytes: 0 %
Lymphocytes Absolute: 1.8 10*3/uL (ref 0.7–3.1)
Lymphs: 23 %
MCH: 27 pg (ref 26.6–33.0)
MCHC: 33.5 g/dL (ref 31.5–35.7)
MCV: 80 fL (ref 79–97)
Monocytes Absolute: 0.7 10*3/uL (ref 0.1–0.9)
Monocytes: 9 %
Neutrophils Absolute: 5.2 10*3/uL (ref 1.4–7.0)
Neutrophils: 68 %
Platelets: 286 10*3/uL (ref 150–450)
RBC: 4.45 x10E6/uL (ref 3.77–5.28)
RDW: 14.3 % (ref 11.7–15.4)
RPR Ser Ql: NONREACTIVE
Rh Factor: NEGATIVE
Rubella Antibodies, IGG: <0.9 index — ABNORMAL LOW (ref >0.99)
WBC: 7.8 10*3/uL (ref 3.4–10.8)

## 2022-09-26 LAB — HCV INTERPRETATION

## 2022-09-26 LAB — HEMOGLOBIN A1C
Est. average glucose Bld gHb Est-mCnc: 117 mg/dL
Hgb A1c MFr Bld: 5.7 % — ABNORMAL HIGH (ref 4.8–5.6)

## 2022-09-28 ENCOUNTER — Encounter: Payer: Self-pay | Admitting: Obstetrics and Gynecology

## 2022-09-28 DIAGNOSIS — O09899 Supervision of other high risk pregnancies, unspecified trimester: Secondary | ICD-10-CM | POA: Insufficient documentation

## 2022-09-28 DIAGNOSIS — R7303 Prediabetes: Secondary | ICD-10-CM | POA: Insufficient documentation

## 2022-09-28 LAB — CULTURE, OB URINE

## 2022-09-28 LAB — URINE CULTURE, OB REFLEX

## 2022-10-02 ENCOUNTER — Other Ambulatory Visit: Payer: BC Managed Care – PPO

## 2022-10-03 ENCOUNTER — Other Ambulatory Visit: Payer: BC Managed Care – PPO

## 2022-10-03 DIAGNOSIS — O0990 Supervision of high risk pregnancy, unspecified, unspecified trimester: Secondary | ICD-10-CM

## 2022-10-04 LAB — GLUCOSE TOLERANCE, 2 HOURS W/ 1HR
Glucose, 1 hour: 132 mg/dL (ref 70–179)
Glucose, 2 hour: 115 mg/dL (ref 70–152)
Glucose, Fasting: 85 mg/dL (ref 70–91)

## 2022-10-06 LAB — HORIZON CUSTOM: REPORT SUMMARY: POSITIVE — AB

## 2022-10-07 ENCOUNTER — Encounter: Payer: Self-pay | Admitting: Obstetrics and Gynecology

## 2022-10-08 NOTE — L&D Delivery Note (Signed)
OB/GYN Faculty Practice Delivery Note  Breena Bevacqua is a 27 y.o. G1P0000 at [redacted]w[redacted]d admitted for SOL, gHTN and DFM.   GBS Status: Positive/-- (06/03 1627) Maximum Maternal Temperature: 98.0  Labor course: Initial SVE: 4 cm. Augmentation with: AROM. She then progressed to complete.  ROM: 3h 7m with clear fluid  Birth: At 1933 a viable female was delivered via spontaneous vaginal delivery (Presentation: ROA) with a compound RT hand. Nuchal cord present: No.  Shoulders and body delivered in usual fashion  loose body cord, body easily delivered through. Infant placed directly on mom's abdomen for bonding/skin-to-skin, baby dried and stimulated. Cord clamped x 2 after 1 minute and cut by FOB, RJ.  Cord blood collected.  The placenta separated spontaneously and delivered via gentle cord traction.  Pitocin infused rapidly IV per protocol.  Fundus firm with massage.  Placenta inspected and appears to be intact with a 3 VC. Placenta/Cord with the following complications: none.  Cord pH: n/a Sponge and instrument count were correct x2.  Intrapartum complications:  None Anesthesia:  epidural Episiotomy: none Lacerations:  2nd degree, vaginal, and LT sulcus Suture Repair: 2.0 vicryl EBL (mL): 351   Infant: APGAR (1 MIN): 9   APGAR (5 MINS): 9   Infant weight: pending  Mom to postpartum.  Baby to Couplet care / Skin to Skin. Placenta to Home with patient - given to FOB, RJ, who has cooler prepared to transport placenta home. Plans to Breastfeed Contraception:  undecided Circumcision: N/A  Note sent to [facility]: [place] for pp visit.  Raelyn Mora , MSN, CNM 03/29/2023 8:37 PM

## 2022-10-10 ENCOUNTER — Encounter: Payer: Self-pay | Admitting: Obstetrics and Gynecology

## 2022-10-11 ENCOUNTER — Encounter: Payer: Self-pay | Admitting: *Deleted

## 2022-10-11 ENCOUNTER — Encounter: Payer: Self-pay | Admitting: Obstetrics and Gynecology

## 2022-10-22 ENCOUNTER — Ambulatory Visit (INDEPENDENT_AMBULATORY_CARE_PROVIDER_SITE_OTHER): Payer: BC Managed Care – PPO | Admitting: Obstetrics and Gynecology

## 2022-10-22 VITALS — BP 109/74 | HR 93 | Wt 301.0 lb

## 2022-10-22 DIAGNOSIS — O99212 Obesity complicating pregnancy, second trimester: Secondary | ICD-10-CM

## 2022-10-22 DIAGNOSIS — O99830 Other infection carrier state complicating pregnancy: Secondary | ICD-10-CM

## 2022-10-22 DIAGNOSIS — O99210 Obesity complicating pregnancy, unspecified trimester: Secondary | ICD-10-CM

## 2022-10-22 DIAGNOSIS — Z3A16 16 weeks gestation of pregnancy: Secondary | ICD-10-CM

## 2022-10-22 DIAGNOSIS — O285 Abnormal chromosomal and genetic finding on antenatal screening of mother: Secondary | ICD-10-CM

## 2022-10-22 DIAGNOSIS — O99891 Other specified diseases and conditions complicating pregnancy: Secondary | ICD-10-CM

## 2022-10-22 DIAGNOSIS — D563 Thalassemia minor: Secondary | ICD-10-CM | POA: Insufficient documentation

## 2022-10-22 DIAGNOSIS — O0990 Supervision of high risk pregnancy, unspecified, unspecified trimester: Secondary | ICD-10-CM

## 2022-10-22 DIAGNOSIS — Z6841 Body Mass Index (BMI) 40.0 and over, adult: Secondary | ICD-10-CM

## 2022-10-22 HISTORY — DX: Thalassemia minor: D56.3

## 2022-10-22 HISTORY — DX: Abnormal chromosomal and genetic finding on antenatal screening of mother: O28.5

## 2022-10-22 LAB — POCT URINALYSIS DIPSTICK
Bilirubin, UA: NEGATIVE
Glucose, UA: NEGATIVE
Ketones, UA: NEGATIVE
Leukocytes, UA: NEGATIVE
Nitrite, UA: NEGATIVE
Protein, UA: NEGATIVE
Spec Grav, UA: >=1.03 — AB (ref 1.010–1.025)
Urobilinogen, UA: 0.2 E.U./dL
pH, UA: 6 (ref 5.0–8.0)

## 2022-10-22 NOTE — Progress Notes (Unsigned)
ROB   AFP Today.   Discuss Natera Results and possible partner testing  Pt asked about U/S.  Pt taking Olly Vitamins.   CC: Back pain and fatigue

## 2022-10-23 LAB — URINALYSIS, ROUTINE W REFLEX MICROSCOPIC
Bilirubin, UA: NEGATIVE
Glucose, UA: NEGATIVE
Ketones, UA: NEGATIVE
Leukocytes,UA: NEGATIVE
Nitrite, UA: NEGATIVE
RBC, UA: NEGATIVE
Specific Gravity, UA: >=1.03 — AB (ref 1.005–1.030)
Urobilinogen, Ur: 0.2 mg/dL (ref 0.2–1.0)
pH, UA: 5.5 (ref 5.0–7.5)

## 2022-10-23 LAB — PROTEIN / CREATININE RATIO, URINE
Creatinine, Urine: 187.6 mg/dL
Protein, Ur: 11 mg/dL
Protein/Creat Ratio: 59 mg/g creat (ref 0–200)

## 2022-10-23 NOTE — Progress Notes (Signed)
   PRENATAL VISIT NOTE  Subjective:  Natalie May is a 27 y.o. G1P0000 at [redacted]w[redacted]d being seen today for ongoing prenatal care.  She is currently monitored for the following issues for this high-risk pregnancy and has Obesity; Supervision of high risk pregnancy, antepartum; Variants of migraine; Congenital anomaly of upper limb; Rubella non-immune status, antepartum; Prediabetes; BMI 45.0-49.9, adult ([place]); Alpha thalassemia silent carrier; and Abnormal genetic test during pregnancy on their problem list.  Patient reports no complaints.  Contractions: Not present. Vag. Bleeding: None.  Movement: Present. Denies leaking of fluid.   The following portions of the patient's history were reviewed and updated as appropriate: allergies, current medications, past family history, past medical history, past social history, past surgical history and problem list.   Objective:   Vitals:   10/22/22 1546  BP: 109/74  Pulse: 93  Weight: (!) 301 lb (136.5 kg)    Fetal Status: Fetal Heart Rate (bpm): 148   Movement: Present     General:  Alert, oriented and cooperative. Patient is in no acute distress.  Skin: Skin is warm and dry. No rash noted.   Cardiovascular: Normal heart rate noted  Respiratory: Normal respiratory effort, no problems with respiration noted  Abdomen: Soft, gravid, appropriate for gestational age.  Pain/Pressure: Absent     Pelvic: Cervical exam deferred        Extremities: Normal range of motion.  Edema: None  Mental Status: Normal mood and affect. Normal behavior. Normal judgment and thought content.   Assessment and Plan:  Pregnancy: G1P0000 at [redacted]w[redacted]d 1. BMI 45.0-49.9, adult (HCC) Weight stable. Goal d/w her. Baseline pre-x labs today - AFP, Serum, Open Spina Bifida - [place] MFM OB DETAIL +14 WK; Future  2. Obesity in pregnancy - AFP, Serum, Open Spina Bifida - [place] MFM OB DETAIL +14 WK; Future  3. Supervision of high risk pregnancy, antepartum - AFP, Serum, Open Spina  Bifida - [place] MFM OB DETAIL +14 WK; Future - Protein / creatinine ratio, urine - Urinalysis, Routine w reflex microscopic  4. [redacted] weeks gestation of pregnancy  5. Alpha thalassemia silent carrier D/w her. FOB to do saliva testing  6. Abnormal genetic test during pregnancy At risk for being an SMA carrier. See above  7. Back pain affecting pregnancy, antepartum Sounds like normal pregnancy discomfort.  - POCT Urinalysis Dipstick - Culture, OB Urine  Preterm labor symptoms and general obstetric precautions including but not limited to vaginal bleeding, contractions, leaking of fluid and fetal movement were reviewed in detail with the patient. Please refer to After Visit Summary for other counseling recommendations.   Return in about 1 month (around 11/22/2022) for in person, md or app, low risk ob.  Future Appointments  Date Time Provider [place]  11/19/2022  7:30 AM [facility] NURSE [facility]  11/19/2022  7:45 AM [facility] US5 [facility]  11/19/2022  3:30 PM Anyanwu, Sallyanne Havers, MD [facility]  12/17/2022  2:30 PM Aletha Halim, MD [facility]    Aletha Halim, MD

## 2022-10-24 LAB — AFP, SERUM, OPEN SPINA BIFIDA
AFP MoM: 1.03
AFP Value: 25.8 ng/mL
Gest. Age on Collection Date: 16 weeks
Maternal Age At EDD: 27.7 yr
OSBR Risk 1 IN: 10000
Test Results:: NEGATIVE
Weight: 301 [lb_av]

## 2022-10-24 LAB — URINE CULTURE, OB REFLEX

## 2022-10-24 LAB — CULTURE, OB URINE

## 2022-10-29 ENCOUNTER — Encounter: Payer: BC Managed Care – PPO | Admitting: Obstetrics and Gynecology

## 2022-11-19 ENCOUNTER — Ambulatory Visit: Payer: BC Managed Care – PPO | Admitting: *Deleted

## 2022-11-19 ENCOUNTER — Encounter: Payer: Self-pay | Admitting: Obstetrics & Gynecology

## 2022-11-19 ENCOUNTER — Encounter: Payer: Self-pay | Admitting: *Deleted

## 2022-11-19 ENCOUNTER — Ambulatory Visit (INDEPENDENT_AMBULATORY_CARE_PROVIDER_SITE_OTHER): Payer: BC Managed Care – PPO | Admitting: Obstetrics & Gynecology

## 2022-11-19 ENCOUNTER — Other Ambulatory Visit: Payer: Self-pay | Admitting: *Deleted

## 2022-11-19 ENCOUNTER — Ambulatory Visit: Payer: BC Managed Care – PPO | Attending: Obstetrics and Gynecology

## 2022-11-19 VITALS — BP 120/65 | HR 84

## 2022-11-19 VITALS — BP 99/69 | HR 89 | Wt 299.2 lb

## 2022-11-19 DIAGNOSIS — Z363 Encounter for antenatal screening for malformations: Secondary | ICD-10-CM | POA: Diagnosis not present

## 2022-11-19 DIAGNOSIS — Z3A20 20 weeks gestation of pregnancy: Secondary | ICD-10-CM

## 2022-11-19 DIAGNOSIS — O99212 Obesity complicating pregnancy, second trimester: Secondary | ICD-10-CM

## 2022-11-19 DIAGNOSIS — Z6841 Body Mass Index (BMI) 40.0 and over, adult: Secondary | ICD-10-CM | POA: Diagnosis not present

## 2022-11-19 DIAGNOSIS — Z148 Genetic carrier of other disease: Secondary | ICD-10-CM

## 2022-11-19 DIAGNOSIS — O285 Abnormal chromosomal and genetic finding on antenatal screening of mother: Secondary | ICD-10-CM

## 2022-11-19 DIAGNOSIS — O0992 Supervision of high risk pregnancy, unspecified, second trimester: Secondary | ICD-10-CM

## 2022-11-19 DIAGNOSIS — O0990 Supervision of high risk pregnancy, unspecified, unspecified trimester: Secondary | ICD-10-CM

## 2022-11-19 DIAGNOSIS — O99210 Obesity complicating pregnancy, unspecified trimester: Secondary | ICD-10-CM | POA: Diagnosis present

## 2022-11-19 DIAGNOSIS — O09899 Supervision of other high risk pregnancies, unspecified trimester: Secondary | ICD-10-CM | POA: Insufficient documentation

## 2022-11-19 DIAGNOSIS — O35BXX0 Maternal care for other (suspected) fetal abnormality and damage, fetal cardiac anomalies, not applicable or unspecified: Secondary | ICD-10-CM | POA: Diagnosis not present

## 2022-11-19 DIAGNOSIS — D563 Thalassemia minor: Secondary | ICD-10-CM

## 2022-11-19 DIAGNOSIS — Z2839 Other underimmunization status: Secondary | ICD-10-CM | POA: Diagnosis not present

## 2022-11-19 DIAGNOSIS — E669 Obesity, unspecified: Secondary | ICD-10-CM

## 2022-11-19 DIAGNOSIS — Z362 Encounter for other antenatal screening follow-up: Secondary | ICD-10-CM

## 2022-11-19 NOTE — Progress Notes (Signed)
PRENATAL VISIT NOTE  Subjective:  Natalie May is a 27 y.o. G1P0000 at [redacted]w[redacted]d being seen today for ongoing prenatal care.  She is currently monitored for the following issues for this high-risk pregnancy and has Maternal morbid obesity, antepartum ([place]); Supervision of high risk pregnancy, antepartum; Variants of migraine; Congenital anomaly of upper limb; Rubella non-immune status, antepartum; Prediabetes; BMI 45.0-49.9, adult ([place]); Alpha thalassemia silent carrier; and SMA carrier increased risk on their problem list.  Patient reports no complaints.  Contractions: Not present. Vag. Bleeding: None.  Movement: Present. Denies leaking of fluid.   The following portions of the patient's history were reviewed and updated as appropriate: allergies, current medications, past family history, past medical history, past social history, past surgical history and problem list.   Objective:   Vitals:   11/19/22 1550  BP: 99/69  Pulse: 89  Weight: 299 lb 3.2 oz (135.7 kg)    Fetal Status: Fetal Heart Rate (bpm): 144   Movement: Present     General:  Alert, oriented and cooperative. Patient is in no acute distress.  Skin: Skin is warm and dry. No rash noted.   Cardiovascular: Normal heart rate noted  Respiratory: Normal respiratory effort, no problems with respiration noted  Abdomen: Soft, gravid, appropriate for gestational age.  Pain/Pressure: Absent     Pelvic: Cervical exam deferred        Extremities: Normal range of motion.  Edema: None  Mental Status: Normal mood and affect. Normal behavior. Normal judgment and thought content.   [place] MFM OB DETAIL +14 WK  Result Date: 11/19/2022 ----------------------------------------------------------------------  OBSTETRICS REPORT                       (Signed Final 11/19/2022 09:05 am) ---------------------------------------------------------------------- Patient Info  ID #:       [id number]                          D.O.B.:  Jun 24, 1996 (26 yrs)   Name:       Natalie May                Visit Date: 11/19/2022 07:33 am ---------------------------------------------------------------------- Performed By  Attending:        Johnell Comings MD         Ref. Address:     [address]                                                             [place], [place]  Performed By:     Nevin Bloodgood          [facility]  Referred By:      [place] Bing MD ---------------------------------------------------------------------- Orders  #  Description                           Code        Ordered By  1  [place] MFM OB DETAIL +14 WK               76811.01    CHARLIE PICKENS ----------------------------------------------------------------------  #  Order #                     Accession #                Episode #  1  [id number]                   [id number]                 [id number] ---------------------------------------------------------------------- Indications  Obesity complicating pregnancy, second         O99.212  trimester (BMI 48)  Genetic carrier (Silent Carrier Alpha Thal,    Z14.8  Increased Risk SMA Carrier)  [redacted] weeks gestation of pregnancy                Z3A.20  Encounter for antenatal screening for          Z36.3  malformations  LR NIPS/Neg AFP ---------------------------------------------------------------------- Fetal Evaluation  Num Of Fetuses:         1  Fetal Heart Rate(bpm):  149  Cardiac Activity:       Observed  Presentation:           Cephalic  Placenta:               Anterior  P. Cord Insertion:      Visualized  Amniotic Fluid  AFI FV:      Within normal limits                              Largest Pocket(cm)                              5.69  ---------------------------------------------------------------------- Biometry  BPD:      47.3  mm     G. Age:  20w 2d         38  %    CI:           72   %    70 - 86  FL/HC:      20.0   %    15.9 - 20.3  HC:      177.4  mm     G. Age:  20w 2d         26  %    HC/AC:      1.09        1.06 - 1.25  AC:      162.8  mm     G. Age:  21w 2d         70  %    FL/BPD:     74.8   %  FL:       35.4  mm     G. Age:  21w 1d         64  %    FL/AC:      21.7   %    20 - 24  HUM:        32  mm     G. Age:  20w 5d         53  %  CER:      20.8  mm     G. Age:  19w 6d         42  %  LV:        7.4  mm  CM:        5.1  mm  Est. FW:     402  gm    0 lb 14 oz      76  % ---------------------------------------------------------------------- OB History  Blood Type:   O-  Maternal Racial/Ethnic Group:   Black (non-Hispanic)  Gravidity:    1         Term:   0        Prem:   0        SAB:   0  TOP:          0       Ectopic:  0        Living: 0 ---------------------------------------------------------------------- Gestational Age  LMP:           20w 4d        Date:  06/28/22                  EDD:   04/04/23  U/S Today:     20w 5d                                        EDD:   04/03/23  Best:          20w 4d     Det. By:  LMP  (06/28/22)          EDD:   04/04/23 ---------------------------------------------------------------------- Anatomy  Cranium:               Appears normal         LVOT:                   Appears normal  Cavum:                 Appears normal         Aortic Arch:            Appears normal  Ventricles:            Appears normal  Ductal Arch:            Appears normal  Choroid Plexus:        Appears normal         Diaphragm:              Appears normal  Cerebellum:            Appears normal         Stomach:                Appears normal, left                                                                        sided  Posterior Fossa:       Appears normal          Abdomen:                Appears normal  Nuchal Fold:           Not applicable (>10    Abdominal Wall:         Appears nml (cord                         wks GA)                                        insert, abd wall)  Face:                  Orbits nl; profile not Cord Vessels:           Appears normal (3                         well visualized                                vessel cord)  Lips:                  Appears normal         Kidneys:                Appear normal  Palate:                Not well visualized    Bladder:                Appears normal  Thoracic:              Appears normal         Spine:                  Appears normal  Heart:                 Appears normal; EIF    Upper Extremities:      Appears normal  RVOT:                  Appears normal         Lower Extremities:  Appears normal  Other:  Gender not well visualized. Nasal bone visualized. Lenses visualized.          Feet and Rt heel visualized. Hands NWV. 3VV and 3VTV visualized.          Technically difficult due to maternal habitus and fetal position. ---------------------------------------------------------------------- Cervix Uterus Adnexa  Cervix  Length:            3.5  cm.  Normal appearance by transabdominal scan  Uterus  No abnormality visualized.  Right Ovary  Not visualized.  Left Ovary  Simple cyst  Cul De Sac  No free fluid seen.  Adnexa  No abnormality visualized ---------------------------------------------------------------------- Comments  This patient was seen for a detailed fetal anatomy scan due  to maternal obesity with a BMI of 48.  Her Horizon test indicates that she is a silent carrier for alpha  thalassemia and she is at increased carrier risk for spinal  muscular atrophy.  Her partner will be screened soon to  determine if he is also a carrier for these conditions.  She denies any significant past medical history and denies  any problems in her current pregnancy.  She had a cell free DNA test earlier in her  pregnancy which  indicated a low risk for trisomy 43, 82, and 13. A female fetus  is predicted.  She was informed that the fetal growth and amniotic fluid  level were appropriate for her gestational age.  On today's exam, an intracardiac echogenic focus was noted  in the left ventricle of the fetal heart.  The small association between an echogenic focus and  Down syndrome was discussed. Due to the echogenic focus  noted today, the patient was offered and declined an  amniocentesis today for definitive diagnosis of fetal  aneuploidy.  She reports that she is comfortable with her  negative cell free DNA test.  The patient was informed that anomalies may be missed due  to technical limitations. If the fetus is in a suboptimal position  or maternal habitus is increased, visualization of the fetus in  the maternal uterus may be impaired.  Due to maternal obesity, we will continue to follow her with  growth ultrasounds throughout her pregnancy.  Weekly fetal testing will be started at around 34 weeks.  She will return in 4 weeks for a follow-up exam to assess the  fetal growth and to complete the views of the anatomy. ----------------------------------------------------------------------                   Ma Rings, MD Electronically Signed Final Report   11/19/2022 09:05 am ----------------------------------------------------------------------   Assessment and Plan:  Pregnancy: G1P0000 at [redacted]w[redacted]d 1. Maternal morbid obesity, antepartum ([facility]) TWG 3.2 oz.  Taking ASA.  Serial growth scans as per MFM.  2. [redacted] weeks gestation of pregnancy 3. Supervision of high risk pregnancy, antepartum Preterm labor symptoms and general obstetric precautions including but not limited to vaginal bleeding, contractions, leaking of fluid and fetal movement were reviewed in detail with the patient. Please refer to After Visit Summary for other counseling recommendations.   Return in about 4 weeks (around 12/17/2022) for OFFICE OB  VISIT (MD or APP).  Future Appointments  Date Time Provider Department Center  12/17/2022  8:30 AM [facility] NURSE [facility]  12/17/2022  8:45 AM [facility] US5 [facility]  12/17/2022  2:30 PM [place] Bing, MD [facility]  01/14/2023  1:30 PM Jerah Esty, Jethro Bastos, MD [facility]    Jaynie Collins,  MD

## 2022-12-17 ENCOUNTER — Ambulatory Visit (INDEPENDENT_AMBULATORY_CARE_PROVIDER_SITE_OTHER): Payer: BC Managed Care – PPO | Admitting: Obstetrics and Gynecology

## 2022-12-17 ENCOUNTER — Ambulatory Visit: Payer: BC Managed Care – PPO | Attending: Obstetrics

## 2022-12-17 ENCOUNTER — Ambulatory Visit: Payer: BC Managed Care – PPO

## 2022-12-17 ENCOUNTER — Other Ambulatory Visit: Payer: Self-pay

## 2022-12-17 VITALS — BP 114/58 | HR 86

## 2022-12-17 VITALS — BP 106/71 | HR 96 | Wt 302.0 lb

## 2022-12-17 DIAGNOSIS — Z148 Genetic carrier of other disease: Secondary | ICD-10-CM

## 2022-12-17 DIAGNOSIS — Z362 Encounter for other antenatal screening follow-up: Secondary | ICD-10-CM

## 2022-12-17 DIAGNOSIS — Z3A24 24 weeks gestation of pregnancy: Secondary | ICD-10-CM

## 2022-12-17 DIAGNOSIS — R7303 Prediabetes: Secondary | ICD-10-CM

## 2022-12-17 DIAGNOSIS — Z2839 Other underimmunization status: Secondary | ICD-10-CM | POA: Diagnosis present

## 2022-12-17 DIAGNOSIS — D563 Thalassemia minor: Secondary | ICD-10-CM

## 2022-12-17 DIAGNOSIS — O0990 Supervision of high risk pregnancy, unspecified, unspecified trimester: Secondary | ICD-10-CM | POA: Insufficient documentation

## 2022-12-17 DIAGNOSIS — O283 Abnormal ultrasonic finding on antenatal screening of mother: Secondary | ICD-10-CM

## 2022-12-17 DIAGNOSIS — O09899 Supervision of other high risk pregnancies, unspecified trimester: Secondary | ICD-10-CM | POA: Diagnosis present

## 2022-12-17 DIAGNOSIS — O99212 Obesity complicating pregnancy, second trimester: Secondary | ICD-10-CM | POA: Diagnosis not present

## 2022-12-17 DIAGNOSIS — E669 Obesity, unspecified: Secondary | ICD-10-CM

## 2022-12-17 DIAGNOSIS — Z6841 Body Mass Index (BMI) 40.0 and over, adult: Secondary | ICD-10-CM

## 2022-12-17 DIAGNOSIS — O0992 Supervision of high risk pregnancy, unspecified, second trimester: Secondary | ICD-10-CM

## 2022-12-17 DIAGNOSIS — O285 Abnormal chromosomal and genetic finding on antenatal screening of mother: Secondary | ICD-10-CM | POA: Diagnosis not present

## 2022-12-17 NOTE — Progress Notes (Signed)
   PRENATAL VISIT NOTE  Subjective:  Natalie May is a 27 y.o. G1P0000 at [redacted]w[redacted]d being seen today for ongoing prenatal care.  She is currently monitored for the following issues for this low-risk pregnancy and has Maternal morbid obesity, antepartum ([place]); Supervision of high risk pregnancy, antepartum; Variants of migraine; Congenital anomaly of upper limb; Rubella non-immune status, antepartum; Prediabetes; BMI 45.0-49.9, adult ([place]); Alpha thalassemia silent carrier; and SMA carrier increased risk on their problem list.  Patient reports no complaints.  Contractions: Not present. Vag. Bleeding: None.  Movement: Present. Denies leaking of fluid.   The following portions of the patient's history were reviewed and updated as appropriate: allergies, current medications, past family history, past medical history, past social history, past surgical history and problem list.   Objective:   Vitals:   12/17/22 1455  BP: 106/71  Pulse: 96  Weight: (!) 302 lb (137 kg)    Fetal Status: Fetal Heart Rate (bpm): 146   Movement: Present     General:  Alert, oriented and cooperative. Patient is in no acute distress.  Skin: Skin is warm and dry. No rash noted.   Cardiovascular: Normal heart rate noted  Respiratory: Normal respiratory effort, no problems with respiration noted  Abdomen: Soft, gravid, appropriate for gestational age.  Pain/Pressure: Present     Pelvic: Cervical exam deferred        Extremities: Normal range of motion.  Edema: None  Mental Status: Normal mood and affect. Normal behavior. Normal judgment and thought content.   Assessment and Plan:  Pregnancy: G1P0000 at [redacted]w[redacted]d 1. BMI 45.0-49.9, adult ([facility]) Weight stable  2. Supervision of high risk pregnancy, antepartum Routine care Normal surveillance growth u/s today>>continue monthly u/s and start ap testing 34wks   Preterm labor symptoms and general obstetric precautions including but not limited to vaginal bleeding,  contractions, leaking of fluid and fetal movement were reviewed in detail with the patient. Please refer to After Visit Summary for other counseling recommendations.   Return in about 3 weeks (around 01/07/2023) for in person, md or app, low risk ob, fasting 2hr GTT.  Future Appointments  Date Time Provider [place]  01/14/2023  1:30 PM Osborne Oman, MD [facility]  01/14/2023  3:15 PM [facility] NURSE [facility]  01/14/2023  3:30 PM [facility] US2 [facility]  02/11/2023 12:30 PM [facility] NURSE [facility]  02/11/2023 12:45 PM [facility] US4 [facility]  03/11/2023 12:30 PM [facility] NURSE [facility]  03/11/2023 12:45 PM [facility] US4 [facility]    Aletha Halim, MD

## 2022-12-17 NOTE — Progress Notes (Signed)
ROB    CC:   None   

## 2023-01-14 ENCOUNTER — Encounter: Payer: Self-pay | Admitting: Obstetrics & Gynecology

## 2023-01-14 ENCOUNTER — Ambulatory Visit (INDEPENDENT_AMBULATORY_CARE_PROVIDER_SITE_OTHER): Payer: BC Managed Care – PPO | Admitting: Obstetrics & Gynecology

## 2023-01-14 ENCOUNTER — Ambulatory Visit: Payer: BC Managed Care – PPO | Attending: Maternal & Fetal Medicine

## 2023-01-14 ENCOUNTER — Other Ambulatory Visit: Payer: BC Managed Care – PPO

## 2023-01-14 ENCOUNTER — Ambulatory Visit: Payer: BC Managed Care – PPO | Admitting: *Deleted

## 2023-01-14 ENCOUNTER — Encounter: Payer: Self-pay | Admitting: *Deleted

## 2023-01-14 VITALS — BP 120/67 | HR 96

## 2023-01-14 VITALS — BP 125/76 | HR 106 | Wt 300.0 lb

## 2023-01-14 DIAGNOSIS — Z2839 Other underimmunization status: Secondary | ICD-10-CM

## 2023-01-14 DIAGNOSIS — O0993 Supervision of high risk pregnancy, unspecified, third trimester: Secondary | ICD-10-CM

## 2023-01-14 DIAGNOSIS — D563 Thalassemia minor: Secondary | ICD-10-CM

## 2023-01-14 DIAGNOSIS — E669 Obesity, unspecified: Secondary | ICD-10-CM

## 2023-01-14 DIAGNOSIS — O99213 Obesity complicating pregnancy, third trimester: Secondary | ICD-10-CM

## 2023-01-14 DIAGNOSIS — O09899 Supervision of other high risk pregnancies, unspecified trimester: Secondary | ICD-10-CM | POA: Insufficient documentation

## 2023-01-14 DIAGNOSIS — O285 Abnormal chromosomal and genetic finding on antenatal screening of mother: Secondary | ICD-10-CM | POA: Diagnosis not present

## 2023-01-14 DIAGNOSIS — Z3A28 28 weeks gestation of pregnancy: Secondary | ICD-10-CM

## 2023-01-14 DIAGNOSIS — O99212 Obesity complicating pregnancy, second trimester: Secondary | ICD-10-CM | POA: Diagnosis present

## 2023-01-14 DIAGNOSIS — O0990 Supervision of high risk pregnancy, unspecified, unspecified trimester: Secondary | ICD-10-CM

## 2023-01-14 DIAGNOSIS — O283 Abnormal ultrasonic finding on antenatal screening of mother: Secondary | ICD-10-CM | POA: Diagnosis present

## 2023-01-14 DIAGNOSIS — Z362 Encounter for other antenatal screening follow-up: Secondary | ICD-10-CM | POA: Diagnosis present

## 2023-01-14 DIAGNOSIS — Z148 Genetic carrier of other disease: Secondary | ICD-10-CM

## 2023-01-14 DIAGNOSIS — O99210 Obesity complicating pregnancy, unspecified trimester: Secondary | ICD-10-CM

## 2023-01-14 NOTE — Progress Notes (Signed)
   PRENATAL VISIT NOTE  Subjective:  Natalie May is a 27 y.o. G1P0000 at 104w4d being seen today for ongoing prenatal care.  She is currently monitored for the following issues for this high-risk pregnancy and has Maternal morbid obesity, antepartum; Supervision of high risk pregnancy, antepartum; Variants of migraine; Congenital anomaly of upper limb; Rubella non-immune status, antepartum; Prediabetes; BMI 45.0-49.9, adult; Alpha thalassemia silent carrier; and SMA carrier increased risk on their problem list.  Patient reports  mild swelling in ankles bilaterally .  Contractions: Not present. Vag. Bleeding: None.  Movement: Present. Denies leaking of fluid.   The following portions of the patient's history were reviewed and updated as appropriate: allergies, current medications, past family history, past medical history, past social history, past surgical history and problem list.   Objective:   Vitals:   01/14/23 1336  BP: 125/76  Pulse: (!) 106  Weight: 300 lb (136.1 kg)    Fetal Status: Fetal Heart Rate (bpm): 144   Movement: Present     General:  Alert, oriented and cooperative. Patient is in no acute distress.  Skin: Skin is warm and dry. No rash noted.   Cardiovascular: Normal heart rate noted  Respiratory: Normal respiratory effort, no problems with respiration noted  Abdomen: Soft, gravid, appropriate for gestational age.  Pain/Pressure: Absent     Pelvic: Cervical exam deferred        Extremities: Normal range of motion.  Edema: Very deep pitting, indentation lasts a long time  Mental Status: Normal mood and affect. Normal behavior. Normal judgment and thought content.   Assessment and Plan:  Pregnancy: G1P0000 at [redacted]w[redacted]d 1. Maternal morbid obesity, antepartum TWG 1 lb. Has growth scan today.   2. [redacted] weeks gestation of pregnancy 3. Supervision of high risk pregnancy, antepartum Third trimester labs today, will follow up results and manage accordingly. Declined Tdap,  will consider next visit. Preterm labor symptoms and general obstetric precautions including but not limited to vaginal bleeding, contractions, leaking of fluid and fetal movement were reviewed in detail with the patient. Please refer to After Visit Summary for other counseling recommendations.   Return in about 2 weeks (around 01/28/2023) for OFFICE OB VISIT (MD or APP).  Future Appointments  Date Time Provider Department Center  01/14/2023  3:15 PM [place] NURSE [facility]  01/14/2023  3:30 PM [facility] US2 [facility]  02/11/2023 12:30 PM [facility] NURSE [facility]  02/11/2023 12:45 PM [facility] US4 [facility]  03/11/2023 12:30 PM [facility] NURSE [facility]  03/11/2023 12:45 PM [facility] US4 [facility]    Jaynie Collins, MD

## 2023-01-14 NOTE — Patient Instructions (Addendum)
Return to office for any scheduled appointments. Call the office or go to the MAU at [facility] if: You begin to have strong, frequent contractions Your water breaks.  Sometimes it is a big gush of fluid, sometimes it is just a trickle that keeps getting your underwear wet or running down your legs You have vaginal bleeding.  It is normal to have a small amount of spotting if your cervix was checked.  You do not feel your baby moving like normal.  If you do not, get something to eat and drink and lay down and focus on feeling your baby move.   If your baby is still not moving like normal, you should call the office or go to MAU. Any other obstetric concerns.   TDaP Vaccine Pregnancy Get the Whooping Cough Vaccine While You Are Pregnant (CDC)  It is important for women to get the whooping cough vaccine in the third trimester of each pregnancy. Vaccines are the best way to prevent this disease. There are 2 different whooping cough vaccines. Both vaccines combine protection against whooping cough, tetanus and diphtheria, but they are for different age groups: Tdap: for everyone 11 years or older, including pregnant women  DTaP: for children 2 months through 6 years of age  You need the whooping cough vaccine during each of your pregnancies The recommended time to get the shot is during your 27th through 36th week of pregnancy, preferably during the earlier part of this time period. The Centers for Disease Control and Prevention (CDC) recommends that pregnant women receive the whooping cough vaccine for adolescents and adults (called Tdap vaccine) during the third trimester of each pregnancy. The recommended time to get the shot is during your 27th through 36th week of pregnancy, preferably during the earlier part of this time period. This replaces the original recommendation that pregnant women get the vaccine only if they had not previously received it. The American  College of Obstetricians and Gynecologists and the American College of Nurse-Midwives support this recommendation.  You should get the whooping cough vaccine while pregnant to pass protection to your baby frame support disabled and/or not supported in this browser  Learn why Natalie May decided to get the whooping cough vaccine in her 3rd trimester of pregnancy and how her baby girl was born with some protection against the disease. Also available on YouTube. After receiving the whooping cough vaccine, your body will create protective antibodies (proteins produced by the body to fight off diseases) and pass some of them to your baby before birth. These antibodies provide your baby some short-term protection against whooping cough in early life. These antibodies can also protect your baby from some of the more serious complications that come along with whooping cough. Your protective antibodies are at their highest about 2 weeks after getting the vaccine, but it takes time to pass them to your baby. So the preferred time to get the whooping cough vaccine is early in your third trimester. The amount of whooping cough antibodies in your body decreases over time. That is why CDC recommends you get a whooping cough vaccine during each pregnancy. Doing so allows each of your babies to get the greatest number of protective antibodies from you. This means each of your babies will get the best protection possible against this disease.  Getting the whooping cough vaccine while pregnant is better than getting the vaccine after you give birth Whooping cough vaccination during pregnancy is ideal so   your baby will have short-term protection as soon as he is born. This early protection is important because your baby will not start getting his whooping cough vaccines until he is 2 months old. These first few months of life are when your baby is at greatest risk for catching whooping cough. This is also when he's at greatest  risk for having severe, potentially life-threating complications from the infection. To avoid that gap in protection, it is best to get a whooping cough vaccine during pregnancy. You will then pass protection to your baby before he is born. To continue protecting your baby, he should get whooping cough vaccines starting at 2 months old. You may never have gotten the Tdap vaccine before and did not get it during this pregnancy. If so, you should make sure to get the vaccine immediately after you give birth, before leaving the hospital or birthing center. It will take about 2 weeks before your body develops protection (antibodies) in response to the vaccine. Once you have protection from the vaccine, you are less likely to give whooping cough to your newborn while caring for him. But remember, your baby will still be at risk for catching whooping cough from others. A recent study looked to see how effective Tdap was at preventing whooping cough in babies whose mothers got the vaccine while pregnant or in the hospital after giving birth. The study found that getting Tdap between 27 through 36 weeks of pregnancy is 85% more effective at preventing whooping cough in babies younger than 2 months old. Blood tests cannot tell if you need a whooping cough vaccine There are no blood tests that can tell you if you have enough antibodies in your body to protect yourself or your baby against whooping cough. Even if you have been sick with whooping cough in the past or previously received the vaccine, you still should get the vaccine during each pregnancy. Breastfeeding may pass some protective antibodies onto your baby By breastfeeding, you may pass some antibodies you have made in response to the vaccine to your baby. When you get a whooping cough vaccine during your pregnancy, you will have antibodies in your breast milk that you can share with your baby as soon as your milk comes in. However, your baby will not get  protective antibodies immediately if you wait to get the whooping cough vaccine until after delivering your baby. This is because it takes about 2 weeks for your body to create antibodies. Learn more about the health benefits of breastfeeding.  

## 2023-01-15 ENCOUNTER — Other Ambulatory Visit: Payer: Self-pay | Admitting: *Deleted

## 2023-01-15 DIAGNOSIS — O99213 Obesity complicating pregnancy, third trimester: Secondary | ICD-10-CM

## 2023-01-15 LAB — GLUCOSE TOLERANCE, 2 HOURS W/ 1HR
Glucose, 1 hour: 131 mg/dL (ref 70–179)
Glucose, 2 hour: 112 mg/dL (ref 70–152)
Glucose, Fasting: 82 mg/dL (ref 70–91)

## 2023-01-15 LAB — CBC
Hematocrit: 34.5 % (ref 34.0–46.6)
Hemoglobin: 11.1 g/dL (ref 11.1–15.9)
MCH: 26.3 pg — ABNORMAL LOW (ref 26.6–33.0)
MCHC: 32.2 g/dL (ref 31.5–35.7)
MCV: 82 fL (ref 79–97)
Platelets: 309 10*3/uL (ref 150–450)
RBC: 4.22 x10E6/uL (ref 3.77–5.28)
RDW: 14.4 % (ref 11.7–15.4)
WBC: 6.2 10*3/uL (ref 3.4–10.8)

## 2023-01-15 LAB — HIV ANTIBODY (ROUTINE TESTING W REFLEX): HIV Screen 4th Generation wRfx: NONREACTIVE

## 2023-01-15 LAB — RPR: RPR Ser Ql: NONREACTIVE

## 2023-01-28 ENCOUNTER — Ambulatory Visit (INDEPENDENT_AMBULATORY_CARE_PROVIDER_SITE_OTHER): Payer: BC Managed Care – PPO | Admitting: Family Medicine

## 2023-01-28 VITALS — BP 112/75 | HR 94 | Wt 303.0 lb

## 2023-01-28 DIAGNOSIS — O09899 Supervision of other high risk pregnancies, unspecified trimester: Secondary | ICD-10-CM

## 2023-01-28 DIAGNOSIS — O285 Abnormal chromosomal and genetic finding on antenatal screening of mother: Secondary | ICD-10-CM

## 2023-01-28 DIAGNOSIS — Z2839 Other underimmunization status: Secondary | ICD-10-CM

## 2023-01-28 DIAGNOSIS — O0990 Supervision of high risk pregnancy, unspecified, unspecified trimester: Secondary | ICD-10-CM

## 2023-01-28 NOTE — Progress Notes (Signed)
   PRENATAL VISIT NOTE  Subjective:  Natalie May is a 27 y.o. G1P0000 at [redacted]w[redacted]d being seen today for ongoing prenatal care.  She is currently monitored for the following issues for this low-risk pregnancy and has Maternal morbid obesity, antepartum; Supervision of high risk pregnancy, antepartum; Variants of migraine; Congenital anomaly of upper limb; Rubella non-immune status, antepartum; Prediabetes; BMI 45.0-49.9, adult; Alpha thalassemia silent carrier; and SMA carrier increased risk on their problem list.  Patient reports no complaints.  Contractions: Not present. Vag. Bleeding: None.  Movement: Present. Denies leaking of fluid.   The following portions of the patient's history were reviewed and updated as appropriate: allergies, current medications, past family history, past medical history, past social history, past surgical history and problem list.   Objective:   Vitals:   01/28/23 1438  BP: 112/75  Pulse: 94  Weight: (!) 303 lb (137.4 kg)    Fetal Status: Fetal Heart Rate (bpm): 132 Fundal Height: 30 cm Movement: Present     General:  Alert, oriented and cooperative. Patient is in no acute distress.  Skin: Skin is warm and dry. No rash noted.   Cardiovascular: Normal heart rate noted  Respiratory: Normal respiratory effort, no problems with respiration noted  Abdomen: Soft, gravid, appropriate for gestational age.  Pain/Pressure: Absent     Pelvic: Cervical exam deferred        Extremities: Normal range of motion.  Edema: Mild pitting, slight indentation  Mental Status: Normal mood and affect. Normal behavior. Normal judgment and thought content.   Assessment and Plan:  Pregnancy: G1P0000 at [redacted]w[redacted]d 1. Supervision of high risk pregnancy, antepartum Discussed LE swelling and prevention.  2. SMA carrier increased risk   3. Rubella non-immune status, antepartum MMR pp  Preterm labor symptoms and general obstetric precautions including but not limited to vaginal  bleeding, contractions, leaking of fluid and fetal movement were reviewed in detail with the patient. Please refer to After Visit Summary for other counseling recommendations.   Return in 2 weeks (on 02/11/2023).  Future Appointments  Date Time Provider Department Center  02/11/2023 12:30 PM [facility] NURSE [facility]  02/11/2023 12:45 PM [facility] US4 [facility]  02/11/2023  3:30 PM Anyanwu, Jethro Bastos, MD [facility]  02/25/2023  1:30 PM [place] Large Jethro Bastos, MD [facility]  02/25/2023  2:30 PM [facility] NURSE [facility]  02/25/2023  2:45 PM [facility] US5 [facility]  03/05/2023 10:30 AM [facility] NURSE [facility]  03/05/2023 10:45 AM [facility] US4 [facility]  03/11/2023 12:30 PM [facility] NURSE [facility]  03/11/2023 12:45 PM [facility] US4 [facility]  03/11/2023  3:30 PM [place] Bing, MD [facility]  03/18/2023  1:30 PM [place] Bing, MD [facility]  03/25/2023  1:30 PM Reva Bores, MD [facility]  04/01/2023  1:30 PM Anyanwu, Jethro Bastos, MD [facility]    Reva Bores, MD

## 2023-02-11 ENCOUNTER — Ambulatory Visit: Payer: BC Managed Care – PPO | Admitting: *Deleted

## 2023-02-11 ENCOUNTER — Encounter: Payer: Self-pay | Admitting: *Deleted

## 2023-02-11 ENCOUNTER — Encounter: Payer: Self-pay | Admitting: Obstetrics & Gynecology

## 2023-02-11 ENCOUNTER — Other Ambulatory Visit: Payer: Self-pay | Admitting: *Deleted

## 2023-02-11 ENCOUNTER — Ambulatory Visit: Payer: BC Managed Care – PPO | Attending: Maternal & Fetal Medicine

## 2023-02-11 ENCOUNTER — Ambulatory Visit (INDEPENDENT_AMBULATORY_CARE_PROVIDER_SITE_OTHER): Payer: BC Managed Care – PPO | Admitting: Obstetrics & Gynecology

## 2023-02-11 VITALS — BP 117/77 | HR 106 | Wt 307.0 lb

## 2023-02-11 VITALS — BP 114/65 | HR 83

## 2023-02-11 DIAGNOSIS — O99212 Obesity complicating pregnancy, second trimester: Secondary | ICD-10-CM

## 2023-02-11 DIAGNOSIS — Z362 Encounter for other antenatal screening follow-up: Secondary | ICD-10-CM

## 2023-02-11 DIAGNOSIS — O0990 Supervision of high risk pregnancy, unspecified, unspecified trimester: Secondary | ICD-10-CM

## 2023-02-11 DIAGNOSIS — O09899 Supervision of other high risk pregnancies, unspecified trimester: Secondary | ICD-10-CM | POA: Diagnosis present

## 2023-02-11 DIAGNOSIS — O283 Abnormal ultrasonic finding on antenatal screening of mother: Secondary | ICD-10-CM | POA: Diagnosis present

## 2023-02-11 DIAGNOSIS — O99213 Obesity complicating pregnancy, third trimester: Secondary | ICD-10-CM

## 2023-02-11 DIAGNOSIS — Z148 Genetic carrier of other disease: Secondary | ICD-10-CM

## 2023-02-11 DIAGNOSIS — Z2839 Other underimmunization status: Secondary | ICD-10-CM | POA: Insufficient documentation

## 2023-02-11 DIAGNOSIS — Z3A32 32 weeks gestation of pregnancy: Secondary | ICD-10-CM

## 2023-02-11 DIAGNOSIS — O99210 Obesity complicating pregnancy, unspecified trimester: Secondary | ICD-10-CM

## 2023-02-11 DIAGNOSIS — O285 Abnormal chromosomal and genetic finding on antenatal screening of mother: Secondary | ICD-10-CM | POA: Diagnosis not present

## 2023-02-11 DIAGNOSIS — E669 Obesity, unspecified: Secondary | ICD-10-CM | POA: Diagnosis not present

## 2023-02-11 DIAGNOSIS — D563 Thalassemia minor: Secondary | ICD-10-CM | POA: Diagnosis not present

## 2023-02-11 NOTE — Progress Notes (Signed)
PRENATAL VISIT NOTE  Subjective:  Natalie May is a 27 y.o. G1P0000 at [redacted]w[redacted]d being seen today for ongoing prenatal care.  She is currently monitored for the following issues for this high-risk pregnancy and has Maternal morbid obesity, antepartum (HCC); Supervision of high risk pregnancy, antepartum; Variants of migraine; Congenital anomaly of upper limb; Rubella non-immune status, antepartum; Prediabetes; BMI 45.0-49.9, adult (HCC); Alpha thalassemia silent carrier; and SMA carrier increased risk on their problem list.  Patient reports no complaints.  Contractions: Not present. Vag. Bleeding: None.  Movement: Present. Denies leaking of fluid.  Wants work letter to work at home starting around 35 weeks.  The following portions of the patient's history were reviewed and updated as appropriate: allergies, current medications, past family history, past medical history, past social history, past surgical history and problem list.   Objective:   Vitals:   02/11/23 1515  BP: 117/77  Pulse: (!) 106  Weight: (!) 307 lb (139.3 kg)    Fetal Status: Fetal Heart Rate (bpm): 146   Movement: Present     General:  Alert, oriented and cooperative. Patient is in no acute distress.  Skin: Skin is warm and dry. No rash noted.   Cardiovascular: Normal heart rate noted  Respiratory: Normal respiratory effort, no problems with respiration noted  Abdomen: Soft, gravid, appropriate for gestational age.  Pain/Pressure: Present     Pelvic: Cervical exam deferred        Extremities: Normal range of motion.  Edema: None  Mental Status: Normal mood and affect. Normal behavior. Normal judgment and thought content.   Imaging: [place] MFM OB FOLLOW UP  Result Date: 02/11/2023 ----------------------------------------------------------------------  OBSTETRICS REPORT                       (Signed Final 02/11/2023 01:43 pm) ---------------------------------------------------------------------- Patient Info  ID #:        [id number]                          D.O.B.:  1996-06-02 (26 yrs)  Name:       Natalie May                Visit Date: 02/11/2023 11:20 am ---------------------------------------------------------------------- Performed By  Attending:        Braxton Feathers DO       Ref. Address:     [address]                                                             [place], [place]                                                             [address]  Performed By:     Isac Sarna        [facility]  Referred By:      [place] Bing MD ---------------------------------------------------------------------- Orders  #  Description                           Code        Ordered By  1  [place] MFM OB FOLLOW UP                   47829.56    Braxton Feathers ----------------------------------------------------------------------  #  Order #                     Accession #                Episode #  1  [id number]                   [id number]                 [id number] ---------------------------------------------------------------------- Indications  Obesity complicating pregnancy, third          O99.213  trimester (BMI 48)  Genetic carrier (Silent Carrier Alpha Thal,    Z14.8  Increased Risk SMA Carrier)  Encounter for other antenatal screening        Z36.2  follow-up  LR NIPS - Female / Neg AFP  [redacted] weeks gestation of pregnancy                Z3A.32  2hr GTT wnl 01/14/23 ---------------------------------------------------------------------- Fetal Evaluation  Num Of Fetuses:         1  Fetal Heart Rate(bpm):  131  Cardiac Activity:       Observed  Presentation:           Cephalic  Placenta:               Anterior  P. Cord Insertion:      Previously visualized  Amniotic Fluid  AFI FV:      Within  normal limits  AFI Sum(cm)     %Tile       Largest Pocket(cm)  18.24           67          6.18  RUQ(cm)       RLQ(cm)       LUQ(cm)        LLQ(cm)  5.33          6.18          3.83           2.9 ---------------------------------------------------------------------- Biometry  BPD:      82.2  mm  G. Age:  33w 0d         57  %    CI:        79.99   %    70 - 86                                                          FL/HC:      21.7   %    19.9 - 21.5  HC:      290.4  mm     G. Age:  32w 0d          7  %    HC/AC:      1.00        0.96 - 1.11  AC:       290   mm     G. Age:  33w 0d         63  %    FL/BPD:     76.5   %    71 - 87  FL:       62.9  mm     G. Age:  32w 4d         37  %    FL/AC:      21.7   %    20 - 24  LV:        4.8  mm  Est. FW:    2046  gm      4 lb 8 oz     46  % ---------------------------------------------------------------------- OB History  Blood Type:   O-  Maternal Racial/Ethnic Group:   Black (non-Hispanic)  Gravidity:    1         Term:   0        Prem:   0        SAB:   0  TOP:          0       Ectopic:  0        Living: 0 ---------------------------------------------------------------------- Gestational Age  LMP:           32w 4d        Date:  06/28/22                  EDD:   04/04/23  U/S Today:     32w 5d                                        EDD:   04/03/23  Best:          32w 4d     Det. By:  LMP  (06/28/22)          EDD:   04/04/23 ---------------------------------------------------------------------- Anatomy  Cranium:               Appears normal         LVOT:                   Previously seen  Cavum:                 Appears normal         Aortic Arch:  Previously seen  Ventricles:            Previously seen        Ductal Arch:            Previously seen  Choroid Plexus:        Previously seen        Diaphragm:              Appears normal  Cerebellum:            Previously seen        Stomach:                Appears normal, left                                                                         sided  Posterior Fossa:       Previously seen        Abdomen:                Previously seen  Nuchal Fold:           Not applicable (>20    Abdominal Wall:         Previously seen                         wks GA)  Face:                  Orbits nl prev;        Cord Vessels:           Previously seen                         profile nwv  Lips:                  Previously seen        Kidneys:                Appear normal  Palate:                Appears normal         Bladder:                Appears normal  Thoracic:              Appears normal         Spine:                  Previously seen  Heart:                 Previously seen, EIF   Upper Extremities:      Previously seen  RVOT:                  Previously seen        Lower Extremities:      Previously seen  Other:  Fetus appears to be female. Nasal bone, lenses, Feet and Rt heel,          3VV and 3VTV previously visualized. Technically difficult due to          maternal habitus and  fetal position. VC and open hands visualized ---------------------------------------------------------------------- Comments  The patient is here for a follow-up ultrasound at 32w 4d for  BMI 48 EDD: 04/04/2023 dated by LMP  (06/28/22). She has  no concerns today.  Sonographic findings  Single intrauterine pregnancy.  Fetal cardiac activity:  Observed and appears normal.  Presentation: Cephalic.  Interval fetal anatomy appears normal.  Fetal biometry shows the estimated fetal weight at the 46  percentile.  Amniotic fluid volume: Within normal limits. MVP: 6.18 cm.  Placenta: Anterior.  Recommendations  1. Serial growth ultrasounds every 4 weeks until delivery  2. Antenatal testing to start around 34 weeks  3. Delivery around [redacted] weeks gestation ----------------------------------------------------------------------                 Braxton Feathers, DO Electronically Signed Final Report   02/11/2023 01:43 pm  ----------------------------------------------------------------------  [place] MFM OB FOLLOW UP  Result Date: 01/14/2023 ----------------------------------------------------------------------  OBSTETRICS REPORT                       (Signed Final 01/14/2023 04:20 pm) ---------------------------------------------------------------------- Patient Info  ID #:       [id number]                          D.O.B.:  01/18/1996 (26 yrs)  Name:       Natalie May                Visit Date: 01/14/2023 01:11 pm ---------------------------------------------------------------------- Performed By  Attending:        Braxton Feathers DO       Ref. Address:     [address]                                                             [place], [place]                                                             [address]  Performed By:     Isac Sarna        Location:         [facility]                    [facility]                                  [facility]  Referred By:      [place] Bing MD ---------------------------------------------------------------------- Orders  #  Description                           Code        Ordered By  1  [place] MFM OB FOLLOW UP                   96045.40    Braxton Feathers ----------------------------------------------------------------------  #  Order #                     Accession #                Episode #  1  [id number]                   [id number]                 [id number] ---------------------------------------------------------------------- Indications  Obesity complicating pregnancy, third          O99.213  trimester (BMI 48)  [redacted] weeks gestation of pregnancy                Z3A.28  Genetic carrier (Silent Carrier Alpha Thal,    Z14.8  Increased Risk SMA Carrier)  Encounter for other antenatal screening        Z36.2  follow-up  LR NIPS - Female  / Neg AFP ---------------------------------------------------------------------- Fetal Evaluation  Num Of Fetuses:         1  Fetal Heart Rate(bpm):  150  Cardiac Activity:       Observed  Presentation:           Cephalic  Placenta:               Anterior  P. Cord Insertion:      Previously visualized  Amniotic Fluid  AFI FV:      Within normal limits  AFI Sum(cm)     %Tile       Largest Pocket(cm)  15.9            57          6.36  RUQ(cm)       RLQ(cm)       LUQ(cm)        LLQ(cm)  3.48          6.36          2.77           3.29 ---------------------------------------------------------------------- Biometry  BPD:      71.8  mm     G. Age:  28w 6d         46  %    CI:        75.26   %    70 - 86                                                          FL/HC:      20.5   %    19.6 - 20.8  HC:      262.5  mm     G. Age:  28w 4d         18  %    HC/AC:      1.07        0.99 - 1.21  AC:      246.2  mm     G. Age:  28w 6d         52  %    FL/BPD:     75.1   %    71 - 87  FL:       53.9  mm     G. Age:  28w 4d         34  %    FL/AC:      21.9   %    20 - 24  LV:        4.2  mm  Est. FW:    1273  gm    2 lb 13 oz      42  % ---------------------------------------------------------------------- OB History  Blood Type:   O-  Maternal Racial/Ethnic Group:   Black (non-Hispanic)  Gravidity:    1         Term:   0        Prem:   0        SAB:   0  TOP:          0       Ectopic:  0        Living: 0 ---------------------------------------------------------------------- Gestational Age  LMP:           28w 4d        Date:  06/28/22                  EDD:   04/04/23  U/S Today:     28w 5d                                        EDD:   04/03/23  Best:          28w 4d     Det. By:  LMP  (06/28/22)          EDD:   04/04/23 ---------------------------------------------------------------------- Anatomy  Cranium:               Appears normal         LVOT:                   Previously seen  Cavum:                 Appears normal         Aortic  Arch:            Previously seen  Ventricles:            Previously seen        Ductal Arch:            Previously seen  Choroid Plexus:        Previously seen        Diaphragm:              Appears normal  Cerebellum:            Previously seen        Stomach:                Appears normal, left  sided  Posterior Fossa:       Previously seen        Abdomen:                Previously seen  Nuchal Fold:           Not applicable (>20    Abdominal Wall:         Previously seen                         wks GA)  Face:                  Orbits nl prev;        Cord Vessels:           Previously seen                         profile wnl  Lips:                  Previously seen        Kidneys:                Appear normal  Palate:                Appears normal         Bladder:                Appears normal  Thoracic:              Appears normal         Spine:                  Previously seen  Heart:                 Previously seen, EIF   Upper Extremities:      Previously seen  RVOT:                  Previously seen        Lower Extremities:      Previously seen  Other:  Fetus appears to be female. Nasal bone, lenses, Feet and Rt heel,          3VV and 3VTV previously visualized. Technically difficult due to          maternal habitus and fetal position. VC and open hands visualized ---------------------------------------------------------------------- Comments  The patient is here for a follow-up ultrasound at 28w 4d for  elevated BMI. EDD: 04/04/2023 dated by LMP  (06/28/22).  She has no concerns today.  Sonographic findings  Single intrauterine pregnancy.  Fetal cardiac activity:  Observed and appears normal.  Presentation: Cephalic.  Interval fetal anatomy appears normal.  Fetal biometry shows the estimated fetal weight at the 42  percentile.  Amniotic fluid volume: Within normal limits. MVP: 6.36 cm.  Placenta: Anterior.  Recommendations  1. Serial growth  ultrasounds every 4 weeks until delivery  2. Antenatal testing to start around 34 weeks ----------------------------------------------------------------------                 Braxton Feathers, DO Electronically Signed Final Report   01/14/2023 04:20 pm ----------------------------------------------------------------------   Assessment and Plan:  Pregnancy: G1P0000 at [redacted]w[redacted]d 1. Maternal morbid obesity, antepartum (HCC) 2. [redacted] weeks gestation of pregnancy 3. Supervision of high risk pregnancy, antepartum Continue scans as per MFM. Work Physicist, medical provided. Preterm labor symptoms and general obstetric precautions including but  not limited to vaginal bleeding, contractions, leaking of fluid and fetal movement were reviewed in detail with the patient. Please refer to After Visit Summary for other counseling recommendations.   Return in about 2 weeks (around 02/25/2023) for OFFICE OB VISIT (MD only).  Future Appointments  Date Time Provider Department Center  02/25/2023  1:00 PM [facility] NURSE [facility]  02/25/2023  1:15 PM [facility] NST [facility]  02/25/2023  2:45 PM [facility] US5 [facility]  02/25/2023  3:30 PM Areen Trautner, Jethro Bastos, MD [facility]  03/05/2023  9:30 AM [facility] NURSE [facility]  03/05/2023  9:45 AM [facility] NST [facility]  03/05/2023 10:45 AM [facility] US4 [facility]  03/11/2023 10:45 AM [facility] US4 [facility]  03/11/2023 12:30 PM [facility] NURSE [facility]  03/11/2023 12:45 PM [facility] US4 [facility]  03/11/2023  3:30 PM [place] Bing, MD [facility]  03/18/2023  1:30 PM [place] Bing, MD [facility]  03/25/2023  1:30 PM Reva Bores, MD [facility]  04/01/2023  1:30 PM [place] Karnes, Jethro Bastos, MD [facility]    Jaynie Collins, MD

## 2023-02-11 NOTE — Patient Instructions (Signed)
Return to office for any scheduled appointments. Call the office or go to the MAU at [facility] if: You begin to have strong, frequent contractions Your water breaks.  Sometimes it is a big gush of fluid, sometimes it is just a trickle that keeps getting your underwear wet or running down your legs You have vaginal bleeding.  It is normal to have a small amount of spotting if your cervix was checked.  You do not feel your baby moving like normal.  If you do not, get something to eat and drink and lay down and focus on feeling your baby move.   If your baby is still not moving like normal, you should call the office or go to MAU. Any other obstetric concerns.  

## 2023-02-25 ENCOUNTER — Other Ambulatory Visit: Payer: Self-pay | Admitting: Maternal & Fetal Medicine

## 2023-02-25 ENCOUNTER — Encounter: Payer: Self-pay | Admitting: *Deleted

## 2023-02-25 ENCOUNTER — Ambulatory Visit: Payer: BC Managed Care – PPO

## 2023-02-25 ENCOUNTER — Encounter: Payer: BC Managed Care – PPO | Admitting: Obstetrics & Gynecology

## 2023-02-25 ENCOUNTER — Ambulatory Visit (INDEPENDENT_AMBULATORY_CARE_PROVIDER_SITE_OTHER): Payer: BC Managed Care – PPO | Admitting: Obstetrics & Gynecology

## 2023-02-25 ENCOUNTER — Telehealth: Payer: Self-pay

## 2023-02-25 ENCOUNTER — Ambulatory Visit: Payer: BC Managed Care – PPO | Attending: Maternal & Fetal Medicine

## 2023-02-25 ENCOUNTER — Ambulatory Visit: Payer: BC Managed Care – PPO | Admitting: *Deleted

## 2023-02-25 ENCOUNTER — Encounter: Payer: Self-pay | Admitting: Obstetrics & Gynecology

## 2023-02-25 VITALS — BP 105/74 | HR 86 | Wt 307.0 lb

## 2023-02-25 DIAGNOSIS — E669 Obesity, unspecified: Secondary | ICD-10-CM

## 2023-02-25 DIAGNOSIS — O99210 Obesity complicating pregnancy, unspecified trimester: Secondary | ICD-10-CM

## 2023-02-25 DIAGNOSIS — O99213 Obesity complicating pregnancy, third trimester: Secondary | ICD-10-CM | POA: Diagnosis not present

## 2023-02-25 DIAGNOSIS — D563 Thalassemia minor: Secondary | ICD-10-CM | POA: Diagnosis not present

## 2023-02-25 DIAGNOSIS — Z3A34 34 weeks gestation of pregnancy: Secondary | ICD-10-CM

## 2023-02-25 DIAGNOSIS — O285 Abnormal chromosomal and genetic finding on antenatal screening of mother: Secondary | ICD-10-CM | POA: Diagnosis not present

## 2023-02-25 DIAGNOSIS — O26893 Other specified pregnancy related conditions, third trimester: Secondary | ICD-10-CM

## 2023-02-25 DIAGNOSIS — Z148 Genetic carrier of other disease: Secondary | ICD-10-CM

## 2023-02-25 DIAGNOSIS — O0990 Supervision of high risk pregnancy, unspecified, unspecified trimester: Secondary | ICD-10-CM

## 2023-02-25 DIAGNOSIS — M25559 Pain in unspecified hip: Secondary | ICD-10-CM

## 2023-02-25 MED ORDER — CYCLOBENZAPRINE HCL 10 MG PO TABS
10.0000 mg | ORAL_TABLET | Freq: Three times a day (TID) | ORAL | 2 refills | Status: DC | PRN
Start: 2023-02-25 — End: 2023-11-20

## 2023-02-25 NOTE — Procedures (Signed)
Natalie May 09/25/1996 [redacted]w[redacted]d  Fetus A Non-Stress Test Interpretation for 02/25/23  Indication:  Obesity  Fetal Heart Rate A Mode: External Baseline Rate (A): 130 bpm Variability: Moderate Accelerations: 15 x 15 Decelerations: None  Uterine Activity Mode: Palpation, Toco Contraction Frequency (min): None  Interpretation (Fetal Testing) Nonstress Test Interpretation: Reactive Comments: Dr. Parke Poisson reviewed tracing

## 2023-02-25 NOTE — Telephone Encounter (Signed)
Left message for patient about her 03/05/23 ultrasound and BPP - will be done at [facility]

## 2023-02-25 NOTE — Progress Notes (Signed)
PRENATAL VISIT NOTE  Subjective:  Natalie May is a 27 y.o. G1P0000 at [redacted]w[redacted]d being seen today for ongoing prenatal care.  She is currently monitored for the following issues for this high-risk pregnancy and has Maternal morbid obesity, antepartum (HCC); Supervision of high risk pregnancy, antepartum; Variants of migraine; Congenital anomaly of upper limb; Rubella non-immune status, antepartum; Prediabetes; BMI 45.0-49.9, adult (HCC); Alpha thalassemia silent carrier; and SMA carrier increased risk on their problem list.  Patient reports  right sided hip pain .  Contractions: Not present. Vag. Bleeding: None.  Movement: Present. Denies leaking of fluid.   The following portions of the patient's history were reviewed and updated as appropriate: allergies, current medications, past family history, past medical history, past social history, past surgical history and problem list.   Objective:   Vitals:   02/25/23 1603  BP: 105/74  Pulse: 86  Weight: (!) 307 lb (139.3 kg)    Fetal Status: Fetal Heart Rate (bpm): 132   Movement: Present     General:  Alert, oriented and cooperative. Patient is in no acute distress.  Skin: Skin is warm and dry. No rash noted.   Cardiovascular: Normal heart rate noted  Respiratory: Normal respiratory effort, no problems with respiration noted  Abdomen: Soft, gravid, appropriate for gestational age.  Pain/Pressure: Present     Pelvic: Cervical exam deferred        Extremities: Normal range of motion.  Edema: None  Mental Status: Normal mood and affect. Normal behavior. Normal judgment and thought content.   Imaging: [place] MFM FETAL BPP W/NONSTRESS  Result Date: 02/25/2023 ----------------------------------------------------------------------  OBSTETRICS REPORT                       (Signed Final 02/25/2023 03:44 pm) ---------------------------------------------------------------------- Patient Info  ID #:       [id number]                          D.O.B.:   02-Feb-1996 (26 yrs)  Name:       Natalie May                Visit Date: 02/25/2023 02:43 pm ---------------------------------------------------------------------- Performed By  Attending:        Ma Rings MD         Ref. Address:     [address]                                                             [place], [place]                                                             [address]  Performed By:     Emeline Darling BS,      [facility]  Referred By:      Floyd Bing MD ---------------------------------------------------------------------- Orders  #  Description                           Code        Ordered By  1  [place] MFM FETAL BPP                      16109.6     Braxton Feathers     W/NONSTRESS ----------------------------------------------------------------------  #  Order #                     Accession #                Episode #  1  [id number]                   [id number]                 [id number] ---------------------------------------------------------------------- Indications  Obesity complicating pregnancy, third          O99.213  trimester (BMI 48)  Genetic carrier (Silent Carrier Alpha Thal,    Z14.8  Increased Risk SMA Carrier)  LR NIPS - Female / Neg AFP  [redacted] weeks gestation of pregnancy                Z3A.34 ---------------------------------------------------------------------- Fetal Evaluation  Num Of Fetuses:         1  Fetal Heart Rate(bpm):  129  Cardiac Activity:       Observed  Presentation:           Cephalic  Placenta:               Anterior  P. Cord Insertion:      Previously visualized  Amniotic Fluid  AFI FV:      Within normal limits  AFI Sum(cm)     %Tile       Largest Pocket(cm)  13.93           49          4.63  RUQ(cm)        RLQ(cm)       LUQ(cm)        LLQ(cm)  3.55          3.21          4.63           2.54 ---------------------------------------------------------------------- Biophysical Evaluation  Amniotic F.V:   Pocket => 2 cm             F. Tone:  Observed  F. Movement:    Observed                   N.S.T:          Reactive  F. Breathing:   Observed                   Score:          10/10 ---------------------------------------------------------------------- OB History  Blood Type:   O-  Maternal Racial/Ethnic Group:   Black (non-Hispanic)  Gravidity:    1         Term:   0        Prem:   0        SAB:   0  TOP:          0       Ectopic:  0        Living: 0 ---------------------------------------------------------------------- Gestational Age  LMP:           34w 4d        Date:  06/28/22                  EDD:   04/04/23  Best:          34w 4d     Det. By:  LMP  (06/28/22)          EDD:   04/04/23 ---------------------------------------------------------------------- Anatomy  Stomach:               Appears normal, left   Bladder:                Appears normal                         sided ---------------------------------------------------------------------- Comments  This patient was seen for fetal testing due to maternal obesity  with a BMI of 48.  She denies any problems since her last  exam.  A biophysical profile performed today was 10/10 with a  reactive NST.  The AFI was 13.93 cm (within normal limits).  She will have a BPP performed at [facility] next week.  She will return to our office in 2 weeks for a growth scan and  BPP. ----------------------------------------------------------------------                   Ma Rings, MD Electronically Signed Final Report   02/25/2023 03:44 pm ----------------------------------------------------------------------   Assessment and Plan:  Pregnancy: G1P0000 at [redacted]w[redacted]d 1. Pregnancy related hip pain, antepartum, third trimester Discussed use of support belts and Flexeril as  needed for pain. Also discussed possibly seeing a chiropractor to see if there are any strategies that can help.  - cyclobenzaprine (FLEXERIL) 10 MG tablet; Take 1 tablet (10 mg total) by mouth 3 (three) times daily as needed for muscle spasms.  Dispense: 30 tablet; Refill: 2  2. Maternal morbid obesity, antepartum (HCC) Already in antenatal testing.  3. [redacted] weeks gestation of pregnancy 4. Supervision of high risk pregnancy, antepartum No other concerns. Preterm labor symptoms and general obstetric precautions including but not limited to vaginal bleeding, contractions, leaking of fluid and fetal movement were reviewed in detail with the patient. Please refer to After Visit Summary for other counseling recommendations.   Return in about 2 weeks (around 03/11/2023) for OFFICE OB VISIT (MD or APP).  Future Appointments  Date Time Provider Department Center  03/05/2023 10:15 AM [facility] NST [facility]  03/11/2023 10:45  AM [facility] US4 [facility]  03/11/2023  3:30 PM [place] Bing, MD [facility]  03/18/2023  1:30 PM  Bing, MD [facility]  03/25/2023  1:30 PM Reva Bores, MD [facility]  04/01/2023  1:30 PM Remon Quinto, Jethro Bastos, MD [facility]    Jaynie Collins, MD

## 2023-02-25 NOTE — Telephone Encounter (Signed)
Left message regarding the change of patients BPP ultrasound appointment being rescheduled to [facility] on 03/05/23

## 2023-03-05 ENCOUNTER — Ambulatory Visit: Payer: BC Managed Care – PPO

## 2023-03-05 ENCOUNTER — Other Ambulatory Visit (INDEPENDENT_AMBULATORY_CARE_PROVIDER_SITE_OTHER): Payer: BC Managed Care – PPO

## 2023-03-05 ENCOUNTER — Ambulatory Visit (INDEPENDENT_AMBULATORY_CARE_PROVIDER_SITE_OTHER): Payer: BC Managed Care – PPO | Admitting: *Deleted

## 2023-03-05 VITALS — BP 116/78 | HR 91 | Wt 311.0 lb

## 2023-03-05 DIAGNOSIS — O99213 Obesity complicating pregnancy, third trimester: Secondary | ICD-10-CM

## 2023-03-05 DIAGNOSIS — O0990 Supervision of high risk pregnancy, unspecified, unspecified trimester: Secondary | ICD-10-CM

## 2023-03-05 DIAGNOSIS — O99210 Obesity complicating pregnancy, unspecified trimester: Secondary | ICD-10-CM

## 2023-03-05 DIAGNOSIS — Z3A35 35 weeks gestation of pregnancy: Secondary | ICD-10-CM

## 2023-03-05 DIAGNOSIS — O0993 Supervision of high risk pregnancy, unspecified, third trimester: Secondary | ICD-10-CM | POA: Diagnosis not present

## 2023-03-05 NOTE — Progress Notes (Signed)
Patient informed that the ultrasound is considered a limited obstetric ultrasound and is not intended to be a complete ultrasound exam.  Patient also informed that the ultrasound is not being completed with the intent of assessing for fetal or placental anomalies or any pelvic abnormalities. Explained that the purpose of today's ultrasound is to assess for fetal well being.  Patient acknowledges the purpose of the exam and the limitations of the study.      Euva Rundell, RN      

## 2023-03-11 ENCOUNTER — Ambulatory Visit: Payer: BC Managed Care – PPO

## 2023-03-11 ENCOUNTER — Other Ambulatory Visit (HOSPITAL_COMMUNITY)
Admission: RE | Admit: 2023-03-11 | Discharge: 2023-03-11 | Disposition: A | Discharge status: Home / Self Care | Payer: BC Managed Care – PPO | Source: Ambulatory Visit | Attending: Obstetrics and Gynecology | Admitting: Obstetrics and Gynecology

## 2023-03-11 ENCOUNTER — Ambulatory Visit (HOSPITAL_BASED_OUTPATIENT_CLINIC_OR_DEPARTMENT_OTHER): Payer: BC Managed Care – PPO

## 2023-03-11 ENCOUNTER — Ambulatory Visit (INDEPENDENT_AMBULATORY_CARE_PROVIDER_SITE_OTHER): Payer: BC Managed Care – PPO | Admitting: Obstetrics and Gynecology

## 2023-03-11 VITALS — BP 124/81 | HR 93 | Wt 316.0 lb

## 2023-03-11 DIAGNOSIS — Z148 Genetic carrier of other disease: Secondary | ICD-10-CM

## 2023-03-11 DIAGNOSIS — D563 Thalassemia minor: Secondary | ICD-10-CM

## 2023-03-11 DIAGNOSIS — E669 Obesity, unspecified: Secondary | ICD-10-CM

## 2023-03-11 DIAGNOSIS — Z3A36 36 weeks gestation of pregnancy: Secondary | ICD-10-CM

## 2023-03-11 DIAGNOSIS — O99213 Obesity complicating pregnancy, third trimester: Secondary | ICD-10-CM | POA: Insufficient documentation

## 2023-03-11 DIAGNOSIS — O99810 Abnormal glucose complicating pregnancy: Secondary | ICD-10-CM

## 2023-03-11 DIAGNOSIS — M5431 Sciatica, right side: Secondary | ICD-10-CM

## 2023-03-11 DIAGNOSIS — O0990 Supervision of high risk pregnancy, unspecified, unspecified trimester: Secondary | ICD-10-CM

## 2023-03-11 DIAGNOSIS — O285 Abnormal chromosomal and genetic finding on antenatal screening of mother: Secondary | ICD-10-CM

## 2023-03-11 DIAGNOSIS — Z6841 Body Mass Index (BMI) 40.0 and over, adult: Secondary | ICD-10-CM

## 2023-03-11 DIAGNOSIS — O99210 Obesity complicating pregnancy, unspecified trimester: Secondary | ICD-10-CM

## 2023-03-11 NOTE — Progress Notes (Unsigned)
ROB  CC: sciatica  back pain no relief with flexeril pt notes becoming hard to do normal task. It is hard for pt to walk at this time.  Swelling in her ankles   Pt would like cervix check

## 2023-03-12 NOTE — Progress Notes (Signed)
   PRENATAL VISIT NOTE  Subjective:  Natalie May is a 27 y.o. G1P0000 at [redacted]w[redacted]d being seen today for ongoing prenatal care.  She is currently monitored for the following issues for this high-risk pregnancy and has Maternal morbid obesity, antepartum (HCC); Supervision of high risk pregnancy, antepartum; Variants of migraine; Congenital anomaly of upper limb; Rubella non-immune status, antepartum; Prediabetes; BMI 45.0-49.9, adult (HCC); Alpha thalassemia silent carrier; and SMA carrier increased risk on their problem list.  Patient reports  right sided low back and some sciatic pain .  Contractions: Not present. Vag. Bleeding: None.  Movement: Present. Denies leaking of fluid.   The following portions of the patient's history were reviewed and updated as appropriate: allergies, current medications, past family history, past medical history, past social history, past surgical history and problem list.   Objective:   Vitals:   03/11/23 1542  BP: 124/81  Pulse: 93  Weight: (!) 316 lb (143.3 kg)    Fetal Status: Fetal Heart Rate (bpm): 145   Movement: Present  Presentation: Vertex  General:  Alert, oriented and cooperative. Patient is in no acute distress.  Skin: Skin is warm and dry. No rash noted.   Cardiovascular: Normal heart rate noted  Respiratory: Normal respiratory effort, no problems with respiration noted  Abdomen: Soft, gravid, appropriate for gestational age.  Pain/Pressure: Present     Pelvic: Cervical exam performed in the presence of a chaperone Dilation: 1.5 Effacement (%): 50 Station: -1  Extremities: Normal range of motion.  Edema: Very deep pitting, indentation lasts a long time  Mental Status: Normal mood and affect. Normal behavior. Normal judgment and thought content.   Assessment and Plan:  Pregnancy: G1P0000 at [redacted]w[redacted]d 1. Supervision of high risk pregnancy, antepartum - Cervicovaginal ancillary only( [place]) - Strep Gp B NAA - Ambulatory referral to  Physical Therapy  2. Right sciatic nerve pain Recommend trying pregnancy massage and doing daily pregnancy youtube stretches and exercises - Ambulatory referral to Physical Therapy  3. BMI 45.0-49.9, adult (HCC) 17 lbs total weight gain this pregnancy Seeing mfm for qwk testing; d/w her their recommendation for IOL around [facility]. Will set up next visit.  6/3: 71%, 3142gm, ac 89%, afi 8.8, cephalic, bpp 8/8  4. Maternal morbid obesity, antepartum (HCC)  5. [redacted] weeks gestation of pregnancy  Preterm labor symptoms and general obstetric precautions including but not limited to vaginal bleeding, contractions, leaking of fluid and fetal movement were reviewed in detail with the patient. Please refer to After Visit Summary for other counseling recommendations.   Return in about 1 week (around 03/18/2023) for low risk ob, in person or virtual, md or app.  Future Appointments  Date Time Provider Department Center  03/18/2023 10:30 AM [facility] NURSE [facility]  03/18/2023 10:45 AM [facility] NST [facility]  03/18/2023  1:30 PM [place] Bing, MD [facility]  03/25/2023 10:30 AM [facility] NURSE [facility]  03/25/2023 10:45 AM [facility] NST [facility]  03/25/2023  1:30 PM Reva Bores, MD [facility]  04/01/2023 10:30 AM [facility] NURSE [facility]  04/01/2023 10:45 AM [facility] NST [facility]  04/01/2023  1:30 PM Anyanwu, Jethro Bastos, MD [facility]    [place] Bing, MD

## 2023-03-13 LAB — CERVICOVAGINAL ANCILLARY ONLY
Chlamydia: NEGATIVE
Comment: NEGATIVE
Comment: NORMAL
Neisseria Gonorrhea: NEGATIVE

## 2023-03-13 LAB — STREP GP B NAA: Strep Gp B NAA: POSITIVE — AB

## 2023-03-18 ENCOUNTER — Encounter: Payer: Self-pay | Admitting: Obstetrics and Gynecology

## 2023-03-18 ENCOUNTER — Telehealth (INDEPENDENT_AMBULATORY_CARE_PROVIDER_SITE_OTHER): Payer: BC Managed Care – PPO | Admitting: Obstetrics and Gynecology

## 2023-03-18 ENCOUNTER — Ambulatory Visit: Payer: BC Managed Care – PPO | Attending: Maternal & Fetal Medicine | Admitting: *Deleted

## 2023-03-18 ENCOUNTER — Ambulatory Visit: Payer: BC Managed Care – PPO | Admitting: *Deleted

## 2023-03-18 VITALS — BP 129/75 | HR 95

## 2023-03-18 DIAGNOSIS — E669 Obesity, unspecified: Secondary | ICD-10-CM | POA: Diagnosis present

## 2023-03-18 DIAGNOSIS — O99213 Obesity complicating pregnancy, third trimester: Secondary | ICD-10-CM | POA: Diagnosis not present

## 2023-03-18 DIAGNOSIS — O0990 Supervision of high risk pregnancy, unspecified, unspecified trimester: Secondary | ICD-10-CM

## 2023-03-18 DIAGNOSIS — Z3A37 37 weeks gestation of pregnancy: Secondary | ICD-10-CM

## 2023-03-18 DIAGNOSIS — Z6841 Body Mass Index (BMI) 40.0 and over, adult: Secondary | ICD-10-CM

## 2023-03-18 DIAGNOSIS — O09899 Supervision of other high risk pregnancies, unspecified trimester: Secondary | ICD-10-CM

## 2023-03-18 NOTE — Procedures (Signed)
Natalie May [birth year] [redacted]w[redacted]d  Fetus A Non-Stress Test Interpretation for 03/18/23  Indication:  Obesity  Fetal Heart Rate A Mode: External Baseline Rate (A): 145 bpm Variability: Moderate Accelerations: 15 x 15 Decelerations: None Multiple birth?: No  Uterine Activity Mode: Palpation, Toco Contraction Frequency (min): Irreg Contraction Duration (sec): 30-50 Contraction Quality: Mild Resting Tone Palpated: Relaxed Resting Time: Adequate  Interpretation (Fetal Testing) Nonstress Test Interpretation: Reactive Comments: Dr. Parke Poisson reviewed tracing.

## 2023-03-18 NOTE — Progress Notes (Signed)
     TELEHEALTH OBSTETRICS VISIT ENCOUNTER NOTE  Provider location: [facility]   Patient location: Home  I connected with Natalie May on 03/18/23 at  1:30 PM EDT by telephone at home and verified that I am speaking with the correct person using two identifiers. Of note, unable to do video encounter due to technical difficulties.    I discussed the limitations, risks, security and privacy concerns of performing an evaluation and management service by telephone and the availability of in person appointments. I also discussed with the patient that there may be a patient responsible charge related to this service. The patient expressed understanding and agreed to proceed.  Subjective:  Natalie May is a 27 y.o. G1P0000 at [redacted]w[redacted]d being followed for ongoing prenatal care.  She is currently monitored for the following issues for this high-risk pregnancy and has Maternal morbid obesity, antepartum (HCC); Supervision of high risk pregnancy, antepartum; Variants of migraine; Congenital anomaly of upper limb; Rubella non-immune status, antepartum; Prediabetes; BMI 45.0-49.9, adult (HCC); Alpha thalassemia silent carrier; and SMA carrier increased risk on their problem list.  Patient reports  continued sciatic pain . Reports fetal movement. Denies any contractions, bleeding or leaking of fluid.   The following portions of the patient's history were reviewed and updated as appropriate: allergies, current medications, past family history, past medical history, past social history, past surgical history and problem list.   Objective:  Last menstrual period 06/28/2022. General:  Alert, oriented and cooperative.   Mental Status: Normal mood and affect perceived. Normal judgment and thought content.  Rest of physical exam deferred due to type of encounter  Assessment and Plan:  Pregnancy: G1P0000 at [redacted]w[redacted]d 1. [redacted] weeks gestation of pregnancy GBS+  2. Maternal  morbid obesity, antepartum (HCC)  3. BMI 50.0-59.9, adult [facility]) Patient amenable to IOL, 39-40wks recommended by mfm. Pt set up for 6/26 in AM. I d/w her re: process and may take time Seen by mfm today for qwk testing  4. Supervision of high risk pregnancy, antepartum   Term labor symptoms and general obstetric precautions including but not limited to vaginal bleeding, contractions, leaking of fluid and fetal movement were reviewed in detail with the patient.  I discussed the assessment and treatment plan with the patient. The patient was provided an opportunity to ask questions and all were answered. The patient agreed with the plan and demonstrated an understanding of the instructions. The patient was advised to call back or seek an in-person office evaluation/go to [facility] at [facility] for any urgent or concerning symptoms. Please refer to After Visit Summary for other counseling recommendations.   I provided 10 minutes of non-face-to-face time during this encounter.  No follow-ups on file.  Future Appointments  Date Time Provider Department Center  03/25/2023 10:30 AM [facility] NURSE [facility]  03/25/2023 10:45 AM [facility] NST [facility] [place]  03/25/2023  1:30 PM Reva Bores, MD [facility]  04/01/2023 10:30 AM [facility] NURSE [facility]  04/01/2023 10:45 AM [facility] NST [facility]  04/01/2023  1:30 PM Anyanwu, Jethro Bastos, MD [facility]  04/03/2023  6:45 AM [facility] SCHED ROOM [facility] None    [place] Bing, MD Center for [facility]

## 2023-03-25 ENCOUNTER — Telehealth (INDEPENDENT_AMBULATORY_CARE_PROVIDER_SITE_OTHER): Payer: BC Managed Care – PPO | Admitting: Family Medicine

## 2023-03-25 ENCOUNTER — Ambulatory Visit: Payer: BC Managed Care – PPO | Admitting: *Deleted

## 2023-03-25 ENCOUNTER — Ambulatory Visit: Payer: BC Managed Care – PPO | Attending: Maternal & Fetal Medicine | Admitting: *Deleted

## 2023-03-25 VITALS — BP 128/91

## 2023-03-25 VITALS — BP 128/78 | HR 92

## 2023-03-25 DIAGNOSIS — O09899 Supervision of other high risk pregnancies, unspecified trimester: Secondary | ICD-10-CM

## 2023-03-25 DIAGNOSIS — R7303 Prediabetes: Secondary | ICD-10-CM

## 2023-03-25 DIAGNOSIS — O0990 Supervision of high risk pregnancy, unspecified, unspecified trimester: Secondary | ICD-10-CM

## 2023-03-25 DIAGNOSIS — Z3A38 38 weeks gestation of pregnancy: Secondary | ICD-10-CM | POA: Diagnosis not present

## 2023-03-25 DIAGNOSIS — Z2839 Other underimmunization status: Secondary | ICD-10-CM

## 2023-03-25 DIAGNOSIS — O99213 Obesity complicating pregnancy, third trimester: Secondary | ICD-10-CM

## 2023-03-25 DIAGNOSIS — O99210 Obesity complicating pregnancy, unspecified trimester: Secondary | ICD-10-CM

## 2023-03-25 DIAGNOSIS — O99283 Endocrine, nutritional and metabolic diseases complicating pregnancy, third trimester: Secondary | ICD-10-CM

## 2023-03-25 NOTE — Progress Notes (Signed)
OBSTETRICS PRENATAL VIRTUAL VISIT ENCOUNTER NOTE  Provider location: [facility]   Patient location: Home  I connected with Natalie May on 03/25/23 at  1:30 PM EDT by MyChart Video Encounter and verified that I am speaking with the correct person using two identifiers. I discussed the limitations, risks, security and privacy concerns of performing an evaluation and management service virtually and the availability of in person appointments. I also discussed with the patient that there may be a patient responsible charge related to this service. The patient expressed understanding and agreed to proceed. Subjective:  Natalie May is a 27 y.o. G1P0000 at [redacted]w[redacted]d being seen today for ongoing prenatal care.  She is currently monitored for the following issues for this low-risk pregnancy and has Maternal morbid obesity, antepartum (HCC); Supervision of high risk pregnancy, antepartum; Variants of migraine; Congenital anomaly of upper limb; Rubella non-immune status, antepartum; Prediabetes; BMI 45.0-49.9, adult (HCC); Alpha thalassemia silent carrier; and SMA carrier increased risk on their problem list.  Patient reports backache and leg pain .  Contractions: Irritability. Vag. Bleeding: None.  Movement: Present. Denies any leaking of fluid.   The following portions of the patient's history were reviewed and updated as appropriate: allergies, current medications, past family history, past medical history, past social history, past surgical history and problem list.   Objective:   Vitals:   03/25/23 1338  BP: (!) 128/91    Fetal Status:     Movement: Present     General:  Alert, oriented and cooperative. Patient is in no acute distress.  Respiratory: Normal respiratory effort, no problems with respiration noted  Mental Status: Normal mood and affect. Normal behavior. Normal judgment and thought content.  Rest of physical exam deferred due to type of  encounter  Imaging: [place] MFM OB FOLLOW UP  Result Date: 03/11/2023 ----------------------------------------------------------------------  OBSTETRICS REPORT                       (Signed Final 03/11/2023 11:37 am) ---------------------------------------------------------------------- Patient Info  ID #:       [id number]                          D.O.B.:  1996-07-15 (27 yrs)  Name:       Natalie May                Visit Date: 03/11/2023 10:50 am ---------------------------------------------------------------------- Performed By  Attending:        Braxton Feathers DO       Ref. Address:     [address]                                                             [place], [place]                                                             [address]  Performed By:     Emeline Darling BS,      [facility]  Referred By:      [place] Bing MD ---------------------------------------------------------------------- Orders  #  Description                           Code        Ordered By  1  [place] MFM OB FOLLOW UP                   76816.01    BURK SCHAIBLE  2  [place] MFM FETAL BPP WO NON               76819.01    [facility]     STRESS ----------------------------------------------------------------------  #  Order #                     Accession #                Episode #  1  [id number]                   [id number]                 [id number]  2  [id number]                   [id number]                 [id number] ---------------------------------------------------------------------- Indications  Obesity complicating pregnancy, third          O99.213  trimester (BMI 48)  Genetic carrier (Silent Carrier Alpha Thal,    Z14.8  Increased Risk SMA Carrier)  Encounter for other antenatal screening         Z36.2  follow-up  Prediabetes                                    O35.8XX0  [redacted] weeks gestation of pregnancy                Z3A.36  LR NIPS - Female / Neg AFP ---------------------------------------------------------------------- Fetal Evaluation  Num Of Fetuses:         1  Fetal Heart Rate(bpm):  144  Cardiac Activity:  Observed  Presentation:           Cephalic  Placenta:               Anterior  P. Cord Insertion:      Previously visualized  Amniotic Fluid  AFI FV:      Within normal limits  AFI Sum(cm)     %Tile       Largest Pocket(cm)  8.83            14          3.83  RUQ(cm)       RLQ(cm)       LUQ(cm)        LLQ(cm)  0.8           3.83          1.5            2.7 ---------------------------------------------------------------------- Biophysical Evaluation  Amniotic F.V:   Pocket => 2 cm             F. Tone:        Observed  F. Movement:    Observed                   Score:          8/8  F. Breathing:   Observed ---------------------------------------------------------------------- Biometry  BPD:      90.5  mm     G. Age:  36w 5d         65  %    CI:        79.55   %    70 - 86                                                          FL/HC:      22.3   %    20.8 - 22.6  HC:      320.7  mm     G. Age:  36w 1d         15  %    HC/AC:      0.95        0.92 - 1.05  AC:      338.9  mm     G. Age:  37w 6d         89  %    FL/BPD:     78.9   %    71 - 87  FL:       71.4  mm     G. Age:  36w 4d         48  %    FL/AC:      21.1   %    20 - 24  Est. FW:    3142  gm    6 lb 15 oz      71  % ---------------------------------------------------------------------- OB History  Blood Type:   O-  Maternal Racial/Ethnic Group:   Black (non-Hispanic)  Gravidity:    1         Term:   0        Prem:   0        SAB:   0  TOP:          0  Ectopic:  0        Living: 0 ---------------------------------------------------------------------- Gestational Age  LMP:           36w 4d        Date:  06/28/22                   EDD:   04/04/23  U/S Today:     36w 6d                                        EDD:   04/02/23  Best:          36w 4d     Det. By:  LMP  (06/28/22)          EDD:   04/04/23 ---------------------------------------------------------------------- Anatomy  Cranium:               Appears normal         LVOT:                   Previously seen  Cavum:                 Previously seen        Aortic Arch:            Previously seen  Ventricles:            Previously seen        Ductal Arch:            Previously seen  Choroid Plexus:        Previously seen        Diaphragm:              Previously seen  Cerebellum:            Previously seen        Stomach:                Appears normal, left                                                                        sided  Posterior Fossa:       Previously seen        Abdomen:                Previously seen  Nuchal Fold:           Not applicable (>20    Abdominal Wall:         Previously seen                         wks GA)  Face:                  Orbits and profile     Cord Vessels:           Previously seen                         previously seen  Lips:  Previously seen        Kidneys:                Appear normal  Palate:                Previously             Bladder:                Appears normal                         visualized  Thoracic:              Appears normal         Spine:                  Previously seen  Heart:                 Previously seen, EIF   Upper Extremities:      Previously seen  RVOT:                  Previously seen        Lower Extremities:      Previously seen  Other:  Fetus appears to be female. Nasal bone, lenses, Feet, Rt heel, 3VV,          Hands, VC, and 3VTV previously visualized. Technically difficult due          to maternal habitus and fetal position. ---------------------------------------------------------------------- Cervix Uterus Adnexa  Cervix  Not visualized (advanced GA >24wks)  ---------------------------------------------------------------------- Comments  The patient is here for a follow-up BPP and growth ultrasound  at 36w 4d for elevated BMI. EDD: 04/04/2023 dated by LMP  (06/28/22). She has no concerns today.  Sonographic findings  Single intrauterine pregnancy.  Fetal cardiac activity: Observed.  Presentation: Cephalic.  Interval fetal anatomy appears normal.  Fetal biometry shows the estimated fetal weight at the 71  percentile.  Amniotic fluid volume: Within normal limits. AFI: 8.83 cm.  MVP: 3.83 cm.  Placenta: Anterior.  BPP: 8/8.  Recommendations  1. Weekly antenatal testing (NSTs) until delivery  2. Delivery around 39-[redacted] weeks gestation ----------------------------------------------------------------------                 Braxton Feathers, DO Electronically Signed Final Report   03/11/2023 11:37 am ----------------------------------------------------------------------  [place] MFM FETAL BPP WO NON STRESS  Result Date: 03/11/2023 ----------------------------------------------------------------------  OBSTETRICS REPORT                       (Signed Final 03/11/2023 11:37 am) ---------------------------------------------------------------------- Patient Info  ID #:       [id number]                          D.O.B.:  10-Aug-1996 (26 yrs)  Name:       Natalie May                Visit Date: 03/11/2023 10:50 am ---------------------------------------------------------------------- Performed By  Attending:        Braxton Feathers DO       Ref. Address:     [address]  [place], [place]                                                             [address]  Performed By:     Emeline Darling BS,      [facility]   Referred By:      [place] Bing MD ---------------------------------------------------------------------- Orders  #  Description                           Code        Ordered By  1  [place] MFM OB FOLLOW UP                   76816.01    BURK SCHAIBLE  2  [place] MFM FETAL BPP WO NON               76819.01    [facility]     STRESS ----------------------------------------------------------------------  #  Order #                     Accession #                Episode #  1  [id number]                   [id number]                 [id number]  2  [id number]                   [id number]                 [id number] ---------------------------------------------------------------------- Indications  Obesity complicating pregnancy, third          O99.213  trimester (BMI 48)  Genetic carrier (Silent Carrier Alpha Thal,    Z14.8  Increased Risk SMA Carrier)  Encounter for other antenatal screening        Z36.2  follow-up  Prediabetes  O35.8XX0  [redacted] weeks gestation of pregnancy                Z3A.36  LR NIPS - Female / Neg AFP ---------------------------------------------------------------------- Fetal Evaluation  Num Of Fetuses:         1  Fetal Heart Rate(bpm):  144  Cardiac Activity:       Observed  Presentation:           Cephalic  Placenta:               Anterior  P. Cord Insertion:      Previously visualized  Amniotic Fluid  AFI FV:      Within normal limits  AFI Sum(cm)     %Tile       Largest Pocket(cm)  8.83            14          3.83  RUQ(cm)       RLQ(cm)       LUQ(cm)        LLQ(cm)  0.8           3.83          1.5            2.7 ---------------------------------------------------------------------- Biophysical Evaluation  Amniotic F.V:   Pocket => 2 cm             F. Tone:        Observed  F. Movement:    Observed                   Score:          8/8  F. Breathing:   Observed ---------------------------------------------------------------------- Biometry  BPD:      90.5  mm      G. Age:  36w 5d         65  %    CI:        79.55   %    70 - 86                                                          FL/HC:      22.3   %    20.8 - 22.6  HC:      320.7  mm     G. Age:  36w 1d         15  %    HC/AC:      0.95        0.92 - 1.05  AC:      338.9  mm     G. Age:  37w 6d         89  %    FL/BPD:     78.9   %    71 - 87  FL:       71.4  mm     G. Age:  36w 4d         48  %    FL/AC:      21.1   %    20 - 24  Est. FW:    3142  gm    6 lb 15 oz      71  % ---------------------------------------------------------------------- OB History  Blood Type:  O-  Maternal Racial/Ethnic Group:   Black (non-Hispanic)  Gravidity:    1         Term:   0        Prem:   0        SAB:   0  TOP:          0       Ectopic:  0        Living: 0 ---------------------------------------------------------------------- Gestational Age  LMP:           36w 4d        Date:  06/28/22                  EDD:   04/04/23  U/S Today:     36w 6d                                        EDD:   04/02/23  Best:          36w 4d     Det. By:  LMP  (06/28/22)          EDD:   04/04/23 ---------------------------------------------------------------------- Anatomy  Cranium:               Appears normal         LVOT:                   Previously seen  Cavum:                 Previously seen        Aortic Arch:            Previously seen  Ventricles:            Previously seen        Ductal Arch:            Previously seen  Choroid Plexus:        Previously seen        Diaphragm:              Previously seen  Cerebellum:            Previously seen        Stomach:                Appears normal, left                                                                        sided  Posterior Fossa:       Previously seen        Abdomen:                Previously seen  Nuchal Fold:           Not applicable (>20    Abdominal Wall:         Previously seen                         wks GA)  Face:  Orbits and profile     Cord Vessels:            Previously seen                         previously seen  Lips:                  Previously seen        Kidneys:                Appear normal  Palate:                Previously             Bladder:                Appears normal                         visualized  Thoracic:              Appears normal         Spine:                  Previously seen  Heart:                 Previously seen, EIF   Upper Extremities:      Previously seen  RVOT:                  Previously seen        Lower Extremities:      Previously seen  Other:  Fetus appears to be female. Nasal bone, lenses, Feet, Rt heel, 3VV,          Hands, VC, and 3VTV previously visualized. Technically difficult due          to maternal habitus and fetal position. ---------------------------------------------------------------------- Cervix Uterus Adnexa  Cervix  Not visualized (advanced GA >24wks) ---------------------------------------------------------------------- Comments  The patient is here for a follow-up BPP and growth ultrasound  at 36w 4d for elevated BMI. EDD: 04/04/2023 dated by LMP  (06/28/22). She has no concerns today.  Sonographic findings  Single intrauterine pregnancy.  Fetal cardiac activity: Observed.  Presentation: Cephalic.  Interval fetal anatomy appears normal.  Fetal biometry shows the estimated fetal weight at the 71  percentile.  Amniotic fluid volume: Within normal limits. AFI: 8.83 cm.  MVP: 3.83 cm.  Placenta: Anterior.  BPP: 8/8.  Recommendations  1. Weekly antenatal testing (NSTs) until delivery  2. Delivery around 39-[redacted] weeks gestation ----------------------------------------------------------------------                 Braxton Feathers, DO Electronically Signed Final Report   03/11/2023 11:37 am ----------------------------------------------------------------------  US FETAL BPP W/NONSTRESS  Result Date: 03/06/2023 ----------------------------------------------------------------------  OBSTETRICS REPORT                        (Signed Final 03/06/2023 10:35 am) ---------------------------------------------------------------------- Patient Info  ID #:       [id number]                          D.O.B.:  09/29/1996 (26 yrs)  Name:       Natalie May                Visit Date: 03/05/2023 11:23 am ---------------------------------------------------------------------- Performed By  Attending:  Ugonna Anyanwu         Ref. Address:     [address]                    [place]                                                             [place], [place]                                                             [address]  Performed By:     Scheryl Marten RN     [facility]  Referred By:      [place] Bing MD ---------------------------------------------------------------------- Orders  #  Description                           Code        Ordered By  1  US FETAL BPP W/NONSTRESS              60454.0     Jaynie Collins ----------------------------------------------------------------------  #  Order #                     Accession #                Episode #  1  [id number]                   [id number]                 [id number] ---------------------------------------------------------------------- Indications  [redacted] weeks gestation of pregnancy                Z3A.35  Obesity complicating pregnancy                 O99.210  E66.9 ---------------------------------------------------------------------- Fetal Evaluation  Num Of Fetuses:         1  Cardiac Activity:       Observed  Presentation:           Cephalic  AFI Sum(cm)     %Tile       Largest Pocket(cm)  7.95            6           2.86  RUQ(cm)       RLQ(cm)       LUQ(cm)        LLQ(cm)  2.86          1.36          2.21           1.52  ---------------------------------------------------------------------- Biophysical Evaluation  Amniotic F.V:   Within normal limits       F. Tone:        Observed  F. Movement:    Observed                   N.S.T:          Reactive  F. Breathing:   Observed                   Score:          10/10 ---------------------------------------------------------------------- OB History  Blood Type:   O-  Maternal Racial/Ethnic Group:   Black (non-Hispanic)  Gravidity:    1         Term:   0        Prem:   0        SAB:   0  TOP:          0       Ectopic:  0        Living: 0 ---------------------------------------------------------------------- Gestational Age  LMP:           35w 5d        Date:  06/28/22                  EDD:   04/04/23  Best:          35w 5d     Det. By:  LMP  (06/28/22)          EDD:   04/04/23 ---------------------------------------------------------------------- Impression  Antenatal testing is reassuring with BPP 10/10.  Normal  amniotic fluid volume. ---------------------------------------------------------------------- Recommendations  Continue weekly antenatal testing till delivery . ----------------------------------------------------------------------              Jaynie Collins, MD Electronically Signed Final Report   03/06/2023 10:35 am ----------------------------------------------------------------------  [place] MFM FETAL BPP W/NONSTRESS  Result Date: 02/25/2023 ----------------------------------------------------------------------  OBSTETRICS REPORT                       (Signed Final 02/25/2023 03:44 pm) ---------------------------------------------------------------------- Patient Info  ID #:       [id number]                          D.O.B.:  05-11-1996 (26 yrs)  Name:       Natalie May                Visit Date: 02/25/2023 02:43 pm ---------------------------------------------------------------------- Performed By  Attending:        Ma Rings MD         Ref. Address:  [address]                                                              [place], [place]                                                             [address]  Performed By:     Emeline Darling BS,      [facility]  Referred By:      [place] Bing MD ---------------------------------------------------------------------- Orders  #  Description                           Code        Ordered By  1  [place] MFM FETAL BPP                      52841.3     Braxton Feathers     W/NONSTRESS ----------------------------------------------------------------------  #  Order #                     Accession #                Episode #  1  [id number]                   [id number]                 [id number] ---------------------------------------------------------------------- Indications  Obesity complicating pregnancy, third          O99.213  trimester (BMI 48)  Genetic carrier (Silent Carrier Alpha Thal,    Z14.8  Increased Risk SMA Carrier)  LR NIPS - Female / Neg AFP  [redacted] weeks gestation of pregnancy  Z3A.34 ---------------------------------------------------------------------- Fetal Evaluation  Num Of Fetuses:         1  Fetal Heart Rate(bpm):  129  Cardiac Activity:       Observed  Presentation:           Cephalic  Placenta:               Anterior  P. Cord Insertion:      Previously visualized  Amniotic Fluid  AFI FV:      Within normal limits  AFI Sum(cm)     %Tile       Largest Pocket(cm)  13.93           49          4.63  RUQ(cm)       RLQ(cm)       LUQ(cm)        LLQ(cm)  3.55          3.21          4.63           2.54 ---------------------------------------------------------------------- Biophysical Evaluation  Amniotic F.V:   Pocket => 2 cm             F. Tone:        Observed  F. Movement:     Observed                   N.S.T:          Reactive  F. Breathing:   Observed                   Score:          10/10 ---------------------------------------------------------------------- OB History  Blood Type:   O-  Maternal Racial/Ethnic Group:   Black (non-Hispanic)  Gravidity:    1         Term:   0        Prem:   0        SAB:   0  TOP:          0       Ectopic:  0        Living: 0 ---------------------------------------------------------------------- Gestational Age  LMP:           34w 4d        Date:  06/28/22                  EDD:   04/04/23  Best:          34w 4d     Det. By:  LMP  (06/28/22)          EDD:   04/04/23 ---------------------------------------------------------------------- Anatomy  Stomach:               Appears normal, left   Bladder:                Appears normal                         sided ---------------------------------------------------------------------- Comments  This patient was seen for fetal testing due to maternal obesity  with a BMI of 48.  She denies any problems since her last  exam.  A biophysical profile performed today was 10/10 with a  reactive NST.  The AFI was 13.93 cm (within normal limits).  She will have a BPP performed at [facility] next week.  She will return to our office in 2  weeks for a growth scan and  BPP. ----------------------------------------------------------------------                   Ma Rings, MD Electronically Signed Final Report   02/25/2023 03:44 pm ----------------------------------------------------------------------   Assessment and Plan:  Pregnancy: G1P0000 at [redacted]w[redacted]d 1. Supervision of high risk pregnancy, antepartum Continue routine prenatal care. Check for signs of nerve impingement with SLR test next visit.  2. Rubella non-immune status, antepartum MMR pp  3. Maternal morbid obesity, antepartum (HCC) In testing with MFM IOL scheduled.  Preterm labor symptoms and general obstetric precautions including but not limited to  vaginal bleeding, contractions, leaking of fluid and fetal movement were reviewed in detail with the patient. I discussed the assessment and treatment plan with the patient. The patient was provided an opportunity to ask questions and all were answered. The patient agreed with the plan and demonstrated an understanding of the instructions. The patient was advised to call back or seek an in-person office evaluation/go to [facility] at [facility] for any urgent or concerning symptoms. Please refer to After Visit Summary for other counseling recommendations.   I provided 11 minutes of face-to-face time during this encounter.  No follow-ups on file.  Future Appointments  Date Time Provider Department Center  04/01/2023 10:30 AM [facility] NURSE [place] [facility]  04/01/2023 10:45 AM [facility] NST W[facility]-MFC [facility]  04/01/2023  1:30 PM Anyanwu, Jethro Bastos, MD [facility]  04/03/2023  6:45 AM MC-LD SCHED ROOM MC-INDC None    Reva Bores, MD Center for [facility], [facility]

## 2023-03-25 NOTE — Procedures (Signed)
Natalie May Jan 27, 1996 [redacted]w[redacted]d  Fetus A Non-Stress Test Interpretation for 03/25/23  Indication:  Obesity  Fetal Heart Rate A Mode: External Baseline Rate (A): 140 bpm Variability: Moderate Accelerations: 15 x 15 Decelerations: None  Uterine Activity Mode: Palpation, Toco Contraction Frequency (min): None  Interpretation (Fetal Testing) Nonstress Test Interpretation: Reactive Comments: Dr. Judeth Cornfield reviewed tracing.

## 2023-03-26 ENCOUNTER — Ambulatory Visit (INDEPENDENT_AMBULATORY_CARE_PROVIDER_SITE_OTHER): Payer: BC Managed Care – PPO | Admitting: Obstetrics and Gynecology

## 2023-03-26 ENCOUNTER — Encounter: Payer: Self-pay | Admitting: Obstetrics and Gynecology

## 2023-03-26 VITALS — BP 112/79 | HR 90 | Wt 312.0 lb

## 2023-03-26 DIAGNOSIS — O0990 Supervision of high risk pregnancy, unspecified, unspecified trimester: Secondary | ICD-10-CM

## 2023-03-26 NOTE — Progress Notes (Signed)
ROB   Here to rule out ROM.  CC: thin watery like leaking/Mucous

## 2023-03-26 NOTE — Progress Notes (Signed)
   PRENATAL VISIT NOTE  Subjective:  Natalie May is a 27 y.o. G1P0000 at [redacted]w[redacted]d being seen today for r/o ROM.  Patient reports  some leaking but not gushes or running down her leg .  Contractions: Irregular. Vag. Bleeding: None.  Movement: Present. Denies leaking of fluid.   The following portions of the patient's history were reviewed and updated as appropriate: allergies, current medications, past family history, past medical history, past social history, past surgical history and problem list.   Objective:   Vitals:   03/26/23 1624  BP: 112/79  Pulse: 90  Weight: (!) 312 lb (141.5 kg)    Fetal Status: Fetal Heart Rate (bpm): 152   Movement: Present     General:  Alert, oriented and cooperative. Patient is in no acute distress.  Skin: Skin is warm and dry. No rash noted.   Cardiovascular: Normal heart rate noted  Respiratory: Normal respiratory effort, no problems with respiration noted  Abdomen: Soft, gravid, appropriate for gestational age.  Pain/Pressure: Present     Pelvic: SSE done - negative for fern/pool/nitrizine         Extremities: Normal range of motion.  Edema: Deep pitting, indentation remains for a short time  Mental Status: Normal mood and affect. Normal behavior. Normal judgment and thought content.   Assessment and Plan:  Pregnancy: G1P0000 at [redacted]w[redacted]d 1. Supervision of high risk pregnancy, antepartum Reviewed labor and ROM symptoms and when to go to MAU. Keep appts as scheduled.   Term labor symptoms and general obstetric precautions including but not limited to vaginal bleeding, contractions, leaking of fluid and fetal movement were reviewed in detail with the patient. Please refer to After Visit Summary for other counseling recommendations.   Return in about 1 week (around 04/02/2023) for HROB VISIT, MD only.  Future Appointments  Date Time Provider Department Center  04/01/2023 10:30 AM [facility] NURSE [facility]  04/01/2023 10:45 AM [facility] NST [facility]-MFC  [facility]  04/01/2023  1:30 PM Anyanwu, Jethro Bastos, MD [facility]  04/03/2023  6:45 AM MC-LD SCHED ROOM MC-INDC None    Milas Hock, MD

## 2023-03-27 ENCOUNTER — Telehealth (HOSPITAL_COMMUNITY): Payer: Self-pay | Admitting: *Deleted

## 2023-03-27 ENCOUNTER — Encounter (HOSPITAL_COMMUNITY): Payer: Self-pay

## 2023-03-27 NOTE — Telephone Encounter (Signed)
Preadmission screen  

## 2023-03-29 ENCOUNTER — Telehealth: Payer: Self-pay

## 2023-03-29 ENCOUNTER — Inpatient Hospital Stay (HOSPITAL_COMMUNITY)
Admission: AD | Admit: 2023-03-29 | Discharge: 2023-03-31 | DRG: 807 | Disposition: A | Discharge status: Home / Self Care | Payer: BC Managed Care – PPO | Attending: Family Medicine | Admitting: Family Medicine

## 2023-03-29 ENCOUNTER — Other Ambulatory Visit: Payer: Self-pay

## 2023-03-29 ENCOUNTER — Inpatient Hospital Stay (HOSPITAL_COMMUNITY): Payer: BC Managed Care – PPO | Admitting: Anesthesiology

## 2023-03-29 ENCOUNTER — Telehealth (HOSPITAL_COMMUNITY): Payer: Self-pay | Admitting: *Deleted

## 2023-03-29 ENCOUNTER — Encounter (HOSPITAL_COMMUNITY): Payer: Self-pay | Admitting: Family Medicine

## 2023-03-29 DIAGNOSIS — Z23 Encounter for immunization: Secondary | ICD-10-CM

## 2023-03-29 DIAGNOSIS — O99214 Obesity complicating childbirth: Secondary | ICD-10-CM | POA: Diagnosis present

## 2023-03-29 DIAGNOSIS — Z7982 Long term (current) use of aspirin: Secondary | ICD-10-CM

## 2023-03-29 DIAGNOSIS — O368130 Decreased fetal movements, third trimester, not applicable or unspecified: Secondary | ICD-10-CM | POA: Diagnosis present

## 2023-03-29 DIAGNOSIS — O285 Abnormal chromosomal and genetic finding on antenatal screening of mother: Secondary | ICD-10-CM | POA: Diagnosis present

## 2023-03-29 DIAGNOSIS — O26893 Other specified pregnancy related conditions, third trimester: Secondary | ICD-10-CM | POA: Diagnosis present

## 2023-03-29 DIAGNOSIS — Z148 Genetic carrier of other disease: Secondary | ICD-10-CM

## 2023-03-29 DIAGNOSIS — Z6791 Unspecified blood type, Rh negative: Secondary | ICD-10-CM

## 2023-03-29 DIAGNOSIS — O1404 Mild to moderate pre-eclampsia, complicating childbirth: Secondary | ICD-10-CM | POA: Diagnosis present

## 2023-03-29 DIAGNOSIS — O0990 Supervision of high risk pregnancy, unspecified, unspecified trimester: Principal | ICD-10-CM

## 2023-03-29 DIAGNOSIS — Z3A39 39 weeks gestation of pregnancy: Secondary | ICD-10-CM

## 2023-03-29 DIAGNOSIS — O09899 Supervision of other high risk pregnancies, unspecified trimester: Secondary | ICD-10-CM

## 2023-03-29 DIAGNOSIS — O1400 Mild to moderate pre-eclampsia, unspecified trimester: Secondary | ICD-10-CM | POA: Clinically undetermined

## 2023-03-29 DIAGNOSIS — O99824 Streptococcus B carrier state complicating childbirth: Secondary | ICD-10-CM | POA: Diagnosis present

## 2023-03-29 DIAGNOSIS — O326XX0 Maternal care for compound presentation, not applicable or unspecified: Secondary | ICD-10-CM | POA: Diagnosis present

## 2023-03-29 DIAGNOSIS — O26899 Other specified pregnancy related conditions, unspecified trimester: Secondary | ICD-10-CM

## 2023-03-29 DIAGNOSIS — Z2839 Other underimmunization status: Secondary | ICD-10-CM

## 2023-03-29 DIAGNOSIS — O134 Gestational [pregnancy-induced] hypertension without significant proteinuria, complicating childbirth: Secondary | ICD-10-CM | POA: Diagnosis present

## 2023-03-29 DIAGNOSIS — D563 Thalassemia minor: Secondary | ICD-10-CM | POA: Diagnosis present

## 2023-03-29 DIAGNOSIS — O99820 Streptococcus B carrier state complicating pregnancy: Secondary | ICD-10-CM

## 2023-03-29 LAB — PROTEIN / CREATININE RATIO, URINE
Creatinine, Urine: 189 mg/dL
Protein Creatinine Ratio: 0.31 mg/mg{Cre} — ABNORMAL HIGH (ref 0.00–0.15)
Total Protein, Urine: 59 mg/dL

## 2023-03-29 LAB — COMPREHENSIVE METABOLIC PANEL
ALT: 15 U/L (ref 0–44)
AST: 18 U/L (ref 15–41)
Albumin: 2.5 g/dL — ABNORMAL LOW (ref 3.5–5.0)
Alkaline Phosphatase: 216 U/L — ABNORMAL HIGH (ref 38–126)
Anion gap: 10 (ref 5–15)
BUN: 5 mg/dL — ABNORMAL LOW (ref 6–20)
CO2: 19 mmol/L — ABNORMAL LOW (ref 22–32)
Calcium: 9.2 mg/dL (ref 8.9–10.3)
Chloride: 108 mmol/L (ref 98–111)
Creatinine, Ser: 0.7 mg/dL (ref 0.44–1.00)
GFR, Estimated: >60 mL/min (ref >60)
Glucose, Bld: 90 mg/dL (ref 70–99)
Potassium: 4 mmol/L (ref 3.5–5.1)
Sodium: 137 mmol/L (ref 135–145)
Total Bilirubin: 0.2 mg/dL — ABNORMAL LOW (ref 0.3–1.2)
Total Protein: 7 g/dL (ref 6.5–8.1)

## 2023-03-29 LAB — CBC
HCT: 35.1 % — ABNORMAL LOW (ref 36.0–46.0)
Hemoglobin: 11.1 g/dL — ABNORMAL LOW (ref 12.0–15.0)
MCH: 24.7 pg — ABNORMAL LOW (ref 26.0–34.0)
MCHC: 31.6 g/dL (ref 30.0–36.0)
MCV: 78 fL — ABNORMAL LOW (ref 80.0–100.0)
Platelets: 317 10*3/uL (ref 150–400)
RBC: 4.5 MIL/uL (ref 3.87–5.11)
RDW: 15.8 % — ABNORMAL HIGH (ref 11.5–15.5)
WBC: 8.8 10*3/uL (ref 4.0–10.5)
nRBC: 0 % (ref 0.0–0.2)

## 2023-03-29 LAB — TYPE AND SCREEN: ABO/RH(D): O NEG

## 2023-03-29 MED ORDER — LACTATED RINGERS IV SOLN: 500.0000 mL | INTRAVENOUS | Status: DC | PRN

## 2023-03-29 MED ORDER — LIDOCAINE HCL (PF) 1 % IJ SOLN
INTRAMUSCULAR | Status: DC | PRN
  Administered 2023-03-29: 8 mL via EPIDURAL

## 2023-03-29 MED ORDER — BENZOCAINE-MENTHOL 20-0.5 % EX AERO
1.0000 | INHALATION_SPRAY | CUTANEOUS | Status: DC | PRN
  Administered 2023-03-29: 1 via TOPICAL
  Filled 2023-03-29: qty 56

## 2023-03-29 MED ORDER — FENTANYL-BUPIVACAINE-NACL 0.5-0.125-0.9 MG/250ML-% EP SOLN
12.0000 mL/h | EPIDURAL | Status: DC | PRN
  Administered 2023-03-29: 12 mL/h via EPIDURAL
  Filled 2023-03-29: qty 250

## 2023-03-29 MED ORDER — WITCH HAZEL-GLYCERIN EX PADS: 1.0000 | MEDICATED_PAD | CUTANEOUS | Status: DC | PRN

## 2023-03-29 MED ORDER — LIDOCAINE HCL (PF) 1 % IJ SOLN
INTRAMUSCULAR | Status: AC
  Administered 2023-03-29: 30 mL via INTRADERMAL
  Filled 2023-03-29: qty 30

## 2023-03-29 MED ORDER — EPHEDRINE 5 MG/ML INJ: 10.0000 mg | INTRAVENOUS | Status: DC | PRN

## 2023-03-29 MED ORDER — LIDOCAINE HCL 1 % IJ SOLN: 30.0000 mL | Freq: Once | INTRAMUSCULAR | Status: AC

## 2023-03-29 MED ORDER — LACTATED RINGERS IV SOLN: INTRAVENOUS | Status: DC

## 2023-03-29 MED ORDER — IBUPROFEN 600 MG PO TABS
600.0000 mg | ORAL_TABLET | Freq: Four times a day (QID) | ORAL | Status: DC
  Administered 2023-03-29: 600 mg via ORAL
  Filled 2023-03-29: qty 1

## 2023-03-29 MED ORDER — ZOLPIDEM TARTRATE 5 MG PO TABS: 5.0000 mg | ORAL_TABLET | Freq: Every evening | ORAL | Status: DC | PRN

## 2023-03-29 MED ORDER — SOD CITRATE-CITRIC ACID 500-334 MG/5ML PO SOLN: 30.0000 mL | ORAL | Status: DC | PRN

## 2023-03-29 MED ORDER — ACETAMINOPHEN 325 MG PO TABS: 650.0000 mg | ORAL_TABLET | ORAL | Status: DC | PRN

## 2023-03-29 MED ORDER — FENTANYL CITRATE (PF) 100 MCG/2ML IJ SOLN
100.0000 ug | INTRAMUSCULAR | Status: DC | PRN
  Administered 2023-03-29: 100 ug via INTRAVENOUS
  Filled 2023-03-29: qty 2

## 2023-03-29 MED ORDER — LACTATED RINGERS IV SOLN: 500.0000 mL | Freq: Once | INTRAVENOUS | Status: DC

## 2023-03-29 MED ORDER — DIBUCAINE (PERIANAL) 1 % EX OINT: 1.0000 | TOPICAL_OINTMENT | CUTANEOUS | Status: DC | PRN

## 2023-03-29 MED ORDER — COCONUT OIL OIL: 1.0000 | TOPICAL_OIL | Status: DC | PRN

## 2023-03-29 MED ORDER — FENTANYL-BUPIVACAINE-NACL 0.5-0.125-0.9 MG/250ML-% EP SOLN
12.0000 mL/h | EPIDURAL | Status: DC | PRN
Start: 2023-03-29 — End: 2023-03-29

## 2023-03-29 MED ORDER — OXYTOCIN-SODIUM CHLORIDE 30-0.9 UT/500ML-% IV SOLN
2.5000 [IU]/h | INTRAVENOUS | Status: DC
  Administered 2023-03-29: 2.5 [IU]/h via INTRAVENOUS
  Filled 2023-03-29: qty 500

## 2023-03-29 MED ORDER — PHENYLEPHRINE 80 MCG/ML (10ML) SYRINGE FOR IV PUSH (FOR BLOOD PRESSURE SUPPORT): 80.0000 ug | PREFILLED_SYRINGE | INTRAVENOUS | Status: DC | PRN

## 2023-03-29 MED ORDER — TETANUS-DIPHTH-ACELL PERTUSSIS 5-2.5-18.5 LF-MCG/0.5 IM SUSY: 0.5000 mL | PREFILLED_SYRINGE | Freq: Once | INTRAMUSCULAR | Status: DC

## 2023-03-29 MED ORDER — DIPHENHYDRAMINE HCL 50 MG/ML IJ SOLN: 12.5000 mg | INTRAMUSCULAR | Status: DC | PRN

## 2023-03-29 MED ORDER — DIPHENHYDRAMINE HCL 25 MG PO CAPS: 25.0000 mg | ORAL_CAPSULE | Freq: Four times a day (QID) | ORAL | Status: DC | PRN

## 2023-03-29 MED ORDER — OXYCODONE-ACETAMINOPHEN 5-325 MG PO TABS: 2.0000 | ORAL_TABLET | ORAL | Status: DC | PRN

## 2023-03-29 MED ORDER — SIMETHICONE 80 MG PO CHEW: 80.0000 mg | CHEWABLE_TABLET | ORAL | Status: DC | PRN

## 2023-03-29 MED ORDER — ACETAMINOPHEN 325 MG PO TABS
650.0000 mg | ORAL_TABLET | ORAL | Status: DC | PRN
  Administered 2023-03-29 – 2023-03-31 (×4): 650 mg via ORAL
  Filled 2023-03-29 (×4): qty 2

## 2023-03-29 MED ORDER — PRENATAL MULTIVITAMIN CH
1.0000 | ORAL_TABLET | Freq: Every day | ORAL | Status: DC
  Administered 2023-03-30 – 2023-03-31 (×2): 1 via ORAL
  Filled 2023-03-29 (×2): qty 1

## 2023-03-29 MED ORDER — IBUPROFEN 600 MG PO TABS
600.0000 mg | ORAL_TABLET | Freq: Four times a day (QID) | ORAL | Status: DC
  Administered 2023-03-30 – 2023-03-31 (×6): 600 mg via ORAL
  Filled 2023-03-29 (×6): qty 1

## 2023-03-29 MED ORDER — LIDOCAINE HCL (PF) 1 % IJ SOLN
30.0000 mL | INTRAMUSCULAR | Status: AC | PRN
  Administered 2023-03-29: 30 mL via SUBCUTANEOUS
  Filled 2023-03-29: qty 30

## 2023-03-29 MED ORDER — OXYCODONE-ACETAMINOPHEN 5-325 MG PO TABS: 1.0000 | ORAL_TABLET | ORAL | Status: DC | PRN

## 2023-03-29 MED ORDER — PENICILLIN G POT IN DEXTROSE 60000 UNIT/ML IV SOLN
3.0000 10*6.[IU] | INTRAVENOUS | Status: DC
  Administered 2023-03-29: 3 10*6.[IU] via INTRAVENOUS
  Filled 2023-03-29: qty 50

## 2023-03-29 MED ORDER — ONDANSETRON HCL 4 MG PO TABS: 4.0000 mg | ORAL_TABLET | ORAL | Status: DC | PRN

## 2023-03-29 MED ORDER — SENNOSIDES-DOCUSATE SODIUM 8.6-50 MG PO TABS
2.0000 | ORAL_TABLET | Freq: Every day | ORAL | Status: DC
  Administered 2023-03-30 – 2023-03-31 (×2): 2 via ORAL
  Filled 2023-03-29 (×2): qty 2

## 2023-03-29 MED ORDER — OXYTOCIN BOLUS FROM INFUSION
333.0000 mL | Freq: Once | INTRAVENOUS | Status: AC
  Administered 2023-03-29: 333 mL via INTRAVENOUS

## 2023-03-29 MED ORDER — ONDANSETRON HCL 4 MG/2ML IJ SOLN: 4.0000 mg | Freq: Four times a day (QID) | INTRAMUSCULAR | Status: DC | PRN

## 2023-03-29 MED ORDER — SODIUM CHLORIDE 0.9 % IV SOLN
5.0000 10*6.[IU] | Freq: Once | INTRAVENOUS | Status: AC
  Administered 2023-03-29: 5 10*6.[IU] via INTRAVENOUS
  Filled 2023-03-29: qty 5

## 2023-03-29 MED ORDER — ONDANSETRON HCL 4 MG/2ML IJ SOLN: 4.0000 mg | INTRAMUSCULAR | Status: DC | PRN

## 2023-03-29 NOTE — Discharge Summary (Signed)
Postpartum Discharge Summary  Date of Service updated***     Patient Name: Natalie May DOB: 1996/05/10 MRN: [id number]  Date of admission: 03/29/2023 Delivery date:03/29/2023  Delivering provider: Raelyn Mora  Date of discharge: 03/30/2023  Admitting diagnosis: Indication for care in labor or delivery [O75.9] Intrauterine pregnancy: [redacted]w[redacted]d     Secondary diagnosis:  Principal Problem:   Indication for care in labor or delivery Active Problems:   Rubella non-immune status, antepartum   Alpha thalassemia silent carrier   SMA carrier increased risk   Pre-eclampsia, mild   Rh negative state in antepartum period  Additional problems: none    Discharge diagnosis: Term Pregnancy Delivered and Preeclampsia (mild)                                              Post partum procedures: none (baby Rh neg) Augmentation: AROM Complications: None  Hospital course: Onset of Labor With Vaginal Delivery      27 y.o. yo G1P0000 at [redacted]w[redacted]d was admitted in Active Labor on 03/29/2023. Labor course was complicated by dx of pre-e without SF (mild range BP elevations with P/C ratio of 0.31).  Membrane Rupture Time/Date: 4:09 PM ,03/29/2023   Delivery Method:Vaginal, Spontaneous  Episiotomy: None  Lacerations:  2nd degree;Sulcus;Vaginal  Patient had a postpartum course remarkable for receiving Lasix 20mg  x 5d, and did/did not *** require additional antihypertensives.  She is ambulating, tolerating a regular diet, passing flatus, and urinating well. Patient is discharged home in stable condition on 03/30/23.  Newborn Data: Birth date:03/29/2023  Birth time:7:32 PM  Gender:Female  Living status:Living  Apgars:9 ,9  Weight:3330 g (7lb 5.5oz)  Magnesium Sulfate received: No BMZ received: No Rhophylac:No (baby Rh neg) WUJ:{WJX:91478295} T-DaP:{Tdap:23962} Flu: Yes - 09/25/2022 Transfusion:No  Physical exam  Vitals:   03/29/23 2220 03/29/23 2331 03/30/23 0335 03/30/23 0730  BP: (!) 141/78  118/69 130/77 110/72  Pulse: 94 96 96 95  Resp: 18 18 17 18   Temp: 98 F (36.7 C) 97.9 F (36.6 C) 97.9 F (36.6 C) 97.6 F (36.4 C)  TempSrc: Axillary Axillary Axillary Oral  SpO2: 100% 100% 96% 98%   General: {Exam; general:21111117} Lochia: {Desc; appropriate/inappropriate:30686::"appropriate"} Uterine Fundus: {Desc; firm/soft:30687} Incision: {Exam; incision:21111123} DVT Evaluation: {Exam; dvt:2111122} Labs: Lab Results  Component Value Date   WBC 8.8 03/29/2023   HGB 11.1 (L) 03/29/2023   HCT 35.1 (L) 03/29/2023   MCV 78.0 (L) 03/29/2023   PLT 317 03/29/2023      Latest Ref Rng & Units 03/29/2023   11:39 AM  CMP  Glucose 70 - 99 mg/dL 90   BUN 6 - 20 mg/dL 5   Creatinine 6.21 - 3.08 mg/dL 6.57   Sodium 846 - 962 mmol/L 137   Potassium 3.5 - 5.1 mmol/L 4.0   Chloride 98 - 111 mmol/L 108   CO2 22 - 32 mmol/L 19   Calcium 8.9 - 10.3 mg/dL 9.2   Total Protein 6.5 - 8.1 g/dL 7.0   Total Bilirubin 0.3 - 1.2 mg/dL 0.2   Alkaline Phos 38 - 126 U/L 216   AST 15 - 41 U/L 18   ALT 0 - 44 U/L 15    Edinburgh Score:     No data to display           After visit meds:  Allergies as of 03/30/2023   No  Known Allergies   Med Rec must be completed prior to using this [facility]***      Discharge home in stable condition Infant Feeding: {Baby feeding:23562} Infant Disposition:{CHL IP OB HOME WITH ZOXWRU:04540} Discharge instruction: per After Visit Summary and Postpartum booklet. Activity: Advance as tolerated. Pelvic rest for 6 weeks.  Diet: routine diet Future Appointments: Future Appointments  Date Time Provider Department Center  04/01/2023 10:30 AM [facility] NURSE [facility]  04/01/2023 10:45 AM [facility] NST [facility]  04/01/2023  1:30 PM Anyanwu, Jethro Bastos, MD [facility]   Follow up Visit:  Message to [facility] sent by R. Arita Miss, CNM on 03/29/2023 Please schedule this patient for a In person postpartum visit in 6 weeks with the following provider: Any  provider. Additional Postpartum F/U:BP check 1 week  High risk pregnancy complicated by: HTN Delivery mode:  Vaginal, Spontaneous  Anticipated Birth Control:  Unsure   03/30/2023 Arabella Merles, CNM

## 2023-03-29 NOTE — H&P (Signed)
OBSTETRIC ADMISSION HISTORY AND PHYSICAL  Natalie May is a 27 y.o. female G1P0000 with IUP at [redacted]w[redacted]d by LMP presenting for SOL, gHTN and no FM since 2000 yesterday (03/28/2023).   Reports fetal movement. Denies vaginal bleeding.  She received her prenatal care at  [facility] .  Support person in labor: RJ - spouse and her mother  Ultrasounds Anatomy U/S: EIF with otherwise normal anatomy Est. FW: 3142 gm  6 lb 15 oz 71 % at 36.4 wks  Prenatal History/Complications: Rubella Non-Immune Maternal Morbid Obesity Prediabetes Alpha Thalassemia Silent Carrier Increased Risk for SMA Carrier  OB BOX: Nursing Staff Provider  Office Location [place] Dating  04/04/2023, by Last Menstrual Period  [facility] Model [ x] Traditional [ ]  Centering [ ]  Mom-Baby Dyad Anatomy US  LVEIF Normal  Language  English    Flu Vaccine  09/25/2022 Genetic/Carrier Screen  NIPS: LR   AFP:   wnl Horizon: alpha thal carrier, SMA carrier  TDaP Vaccine    Hgb A1C or  GTT Early 85/132/115 Third trimester  Nml 2 hr GTT  COVID Vaccine Received x 2 doses    LAB RESULTS   Rhogam  O/Negative/-- (12/19 1612)  Blood Type O/Negative/-- (12/19 1612)   Baby Feeding Plan Breast  Antibody Negative (12/19 1612)  Contraception Undecided Rubella <0.90 (12/19 1612) NON-IMMUNE  Circumcision Yes  RPR Non Reactive (04/08 1112)   Pediatrician  Undecided  HBsAg Negative (12/19 1612)   Support Person Husband - RJ  HCVAb Non Reactive (12/19 1612)   Prenatal Classes  HIV Non Reactive (04/08 1112)     BTL Consent N/A GBS Positive/-- (06/03 1627) neg  VBAC Consent N/A Pap Diagnosis  Date Value Ref Range Status  06/21/2021   Final   - Negative for intraepithelial lesion or malignancy (NILM)         DME Rx [ ]  BP cuff [ ]  Weight Scale Waterbirth  [ ]  Class [ ]  Consent [ ]  CNM visit  PHQ9 & GAD7 [ x ] new OB [  ] 28 weeks  [  ] 36 weeks Induction  [ ]  Orders Entered [ ] Foley Y/N   Past Medical History: Past Medical History:   Diagnosis Date   Medical history non-contributory     Past Surgical History: Past Surgical History:  Procedure Laterality Date   FINGER SURGERY      Obstetrical History: OB History     Gravida  1   Para  0   Term  0   Preterm  0   AB  0   Living  0      SAB  0   IAB  0   Ectopic  0   Multiple  0   Live Births  0           Social History: Social History   Socioeconomic History   Marital status: Married    Spouse name: Not on file   Number of children: Not on file   Years of education: Not on file   Highest education level: Not on file  Occupational History   Not on file  Tobacco Use   Smoking status: Never   Smokeless tobacco: Never  Vaping Use   Vaping Use: Never used  Substance and Sexual Activity   Alcohol use: Yes   Drug use: Never   Sexual activity: Yes    Birth control/protection: None  Other Topics Concern   Not on file  Social History Narrative  Not on file   Social Determinants of Health   Financial Resource Strain: Not on file  Food Insecurity: No Food Insecurity (03/29/2023)   Hunger Vital Sign    Worried About Running Out of Food in the Last Year: Never true    Ran Out of Food in the Last Year: Never true  Transportation Needs: No Transportation Needs (03/29/2023)   PRAPARE - Administrator, Civil Service (Medical): No    Lack of Transportation (Non-Medical): No  Physical Activity: Not on file  Stress: Not on file  Social Connections: Not on file    Family History: Family History  Problem Relation Age of Onset   Asthma Mother    Asthma Maternal Aunt    Hypertension Maternal Grandmother    Diabetes Maternal Grandmother    Stroke Maternal Grandfather    Breast cancer Maternal Great-grandmother     Allergies: No Known Allergies  Medications Prior to Admission  Medication Sig Dispense Refill Last Dose   aspirin EC 81 MG tablet Take 1 tablet (81 mg total) by mouth daily. Swallow whole. 90 tablet 3  03/28/2023   cyclobenzaprine (FLEXERIL) 10 MG tablet Take 1 tablet (10 mg total) by mouth 3 (three) times daily as needed for muscle spasms. 30 tablet 2 Past Month   Prenatal Vit-Fe Fumarate-FA (PRENATAL MULTIVITAMIN) TABS tablet Take 1 tablet by mouth daily at 12 noon.   03/28/2023   Doxylamine-Pyridoxine (DICLEGIS) 10-10 MG TBEC Take 2 tablets by mouth at bedtime. If symptoms persist, add one tablet in the morning and one in the afternoon (Patient not taking: Reported on 10/22/2022) 100 tablet 5      Review of Systems  All systems reviewed and negative except as stated in HPI  Blood pressure 129/62, pulse 87, temperature (!) 97.5 F (36.4 C), temperature source Oral, resp. rate 16, last menstrual period 06/28/2022, SpO2 96 %. General appearance: alert, cooperative, mild distress, and morbidly obese Lungs: no respiratory distress Heart: regular rate  Abdomen: soft, non-tender; gravid Pelvic: adequate Extremities: Homans sign is negative, no sign of DVT Presentation: cephalic Fetal monitoring: Baseline rate 145 bpm   Variability moderate  Accelerations absent   Decelerations none Uterine activity: regular every 2-3 mins  Dilation: 10 Effacement (%): 100 Station: Plus 2 Exam by:: Travor Royce,CNM BBOW - ?forebag ruptured at unknown time  AROM Procedure: Verbal consent to proceed with AROM after discussion of r/b and active management of labor. Large amount of clear fluid in return. Patient verbalized an understanding of the plan of care and agrees. Patient tolerated the procedure well.  Prenatal labs: ABO, Rh: --/--/O NEG (06/21 1130) Antibody: NEG (06/21 1130) Rubella: <0.90 (12/19 1612) Non-Immune RPR: Non Reactive (04/08 1112)  HBsAg: Negative (12/19 1612)  HIV: Non Reactive (04/08 1112)  GBS: Positive/-- (06/03 1627)  Glucola: Normal Genetic screening:  LR NIPS and AFP WNL  Prenatal Transfer Tool  Maternal Diabetes: No Genetic Screening: Normal Maternal Ultrasounds/Referrals:  Isolated EIF (echogenic intracardiac focus) Fetal Ultrasounds or other Referrals:  None Maternal Substance Abuse:  No Significant Maternal Medications:  None Significant Maternal Lab Results: Group B Strep positive and Rh negative  Results for orders placed or performed during the hospital encounter of 03/29/23 (from the past 24 hour(s))  Type and screen [facility]   Collection Time: 03/29/23 11:30 AM  Result Value Ref Range   ABO/RH(D) O NEG    Antibody Screen NEG    Sample Expiration      04/01/2023,2359 Performed at  [facility], [address] [place], [place] [address]   Comprehensive metabolic panel   Collection Time: 03/29/23 11:39 AM  Result Value Ref Range   Sodium 137 135 - 145 mmol/L   Potassium 4.0 3.5 - 5.1 mmol/L   Chloride 108 98 - 111 mmol/L   CO2 19 (L) 22 - 32 mmol/L   Glucose, Bld 90 70 - 99 mg/dL   BUN 5 (L) 6 - 20 mg/dL   Creatinine, Ser 4.40 0.44 - 1.00 mg/dL   Calcium 9.2 8.9 - 34.7 mg/dL   Total Protein 7.0 6.5 - 8.1 g/dL   Albumin 2.5 (L) 3.5 - 5.0 g/dL   AST 18 15 - 41 U/L   ALT 15 0 - 44 U/L   Alkaline Phosphatase 216 (H) 38 - 126 U/L   Total Bilirubin 0.2 (L) 0.3 - 1.2 mg/dL   GFR, Estimated >42 >59 mL/min   Anion gap 10 5 - 15  CBC   Collection Time: 03/29/23 11:39 AM  Result Value Ref Range   WBC 8.8 4.0 - 10.5 K/uL   RBC 4.50 3.87 - 5.11 MIL/uL   Hemoglobin 11.1 (L) 12.0 - 15.0 g/dL   HCT 56.3 (L) 87.5 - 64.3 %   MCV 78.0 (L) 80.0 - 100.0 fL   MCH 24.7 (L) 26.0 - 34.0 pg   MCHC 31.6 30.0 - 36.0 g/dL   RDW 32.9 (H) 51.8 - 84.1 %   Platelets 317 150 - 400 K/uL   nRBC 0.0 0.0 - 0.2 %    Patient Active Problem List   Diagnosis Date Noted   Indication for care in labor or delivery 03/29/2023   BMI 45.0-49.9, adult (HCC) 10/22/2022   Alpha thalassemia silent carrier 10/22/2022   SMA carrier increased risk 10/22/2022   Rubella non-immune status, antepartum 09/28/2022   Prediabetes 09/28/2022   Supervision of  high risk pregnancy, antepartum 08/28/2022   Maternal morbid obesity, antepartum (HCC) 06/22/2021   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 G1P0000 at [redacted]w[redacted]d here for SOL, gHTN and no FM since last night at 2000.  Labor: active -- pain control: epidural  Fetal Wellbeing: EFW 8 lbs by Leopold's. Cephalic by SVE.  -- GBS (POS) -- continuous fetal monitoring - Cat. 1   Postpartum Planning -- breast -- Unsure of method for contraception    Raelyn Mora, CNM  03/29/2023, 4:22 PM

## 2023-03-29 NOTE — Plan of Care (Signed)
  Problem: Education: Goal: Knowledge of General Education information will improve Description: Including pain rating scale, medication(s)/side effects and non-pharmacologic comfort measures Outcome: Progressing   Problem: Health Behavior/Discharge Planning: Goal: Ability to manage health-related needs will improve Outcome: Progressing   Problem: Clinical Measurements: Goal: Ability to maintain clinical measurements within normal limits will improve Outcome: Progressing Goal: Will remain free from infection Outcome: Progressing Goal: Diagnostic test results will improve Outcome: Progressing Goal: Respiratory complications will improve Outcome: Progressing Goal: Cardiovascular complication will be avoided Outcome: Progressing   Problem: Activity: Goal: Risk for activity intolerance will decrease Outcome: Progressing   Problem: Nutrition: Goal: Adequate nutrition will be maintained Outcome: Progressing   Problem: Coping: Goal: Level of anxiety will decrease Outcome: Progressing   Problem: Elimination: Goal: Will not experience complications related to bowel motility Outcome: Progressing Goal: Will not experience complications related to urinary retention Outcome: Progressing   Problem: Pain Managment: Goal: General experience of comfort will improve Outcome: Progressing   Problem: Safety: Goal: Ability to remain free from injury will improve Outcome: Progressing   Problem: Skin Integrity: Goal: Risk for impaired skin integrity will decrease Outcome: Progressing   Problem: Education: Goal: Knowledge of Childbirth will improve Outcome: Completed/Met Goal: Ability to make informed decisions regarding treatment and plan of care will improve Outcome: Completed/Met Goal: Ability to state and carry out methods to decrease the pain will improve Outcome: Completed/Met Goal: Individualized Educational Video(s) Outcome: Completed/Met   Problem: Coping: Goal: Ability  to verbalize concerns and feelings about labor and delivery will improve Outcome: Completed/Met   Problem: Life Cycle: Goal: Ability to make normal progression through stages of labor will improve Outcome: Completed/Met Goal: Ability to effectively push during vaginal delivery will improve Outcome: Completed/Met   Problem: Role Relationship: Goal: Will demonstrate positive interactions with the child Outcome: Completed/Met   Problem: Safety: Goal: Risk of complications during labor and delivery will decrease Outcome: Completed/Met   Problem: Pain Management: Goal: Relief or control of pain from uterine contractions will improve Outcome: Completed/Met   

## 2023-03-29 NOTE — MAU Note (Addendum)
Natalie May is a 27 y.o. at [redacted]w[redacted]d here in MAU reporting: ctxs that 2-4 minutes apart that began earlier this morning.  Denies VB or LOF.  Reports hadn't felt any FM today. LMP: NA Onset of complaint: today Pain score: 8 Vitals:   03/29/23 1051  BP: 139/85  Pulse: 81  Temp: (!) 97.5 F (36.4 C)  SpO2: 98%     FHT:140 bpm Lab orders placed from triage:   UA

## 2023-03-29 NOTE — Anesthesia Procedure Notes (Signed)
Epidural Patient location during procedure: OB Start time: 03/29/2023 1:53 PM End time: 03/29/2023 1:56 PM  Staffing Anesthesiologist: Bethena Midget, MD  Preanesthetic Checklist Completed: patient identified, IV checked, site marked, risks and benefits discussed, surgical consent, monitors and equipment checked, pre-op evaluation and timeout performed  Epidural Patient position: sitting Prep: DuraPrep and site prepped and draped Patient monitoring: continuous pulse ox and blood pressure Approach: midline Location: L2-L3 Injection technique: LOR air  Needle:  Needle type: Tuohy  Needle gauge: 17 G Needle length: 9 cm and 9 Needle insertion depth: 5 cm cm Catheter type: closed end flexible Catheter size: 19 Gauge Catheter at skin depth: 10 cm Test dose: negative  Assessment Events: blood not aspirated, no cerebrospinal fluid, injection not painful, no injection resistance, no paresthesia and negative IV test

## 2023-03-29 NOTE — Telephone Encounter (Signed)
Preadmission screen  

## 2023-03-29 NOTE — Telephone Encounter (Signed)
Return triage call to pt /Mother regarding pt having contractions and decreased FM Pt has not felt baby move since last night Pt states she has been contracting since early am. Contractions are now 4-7 mins apart lasting at least 1 min. advised to go to MAU right now  Pt voiced understanding address to MAU confirmed to pt and her mother.

## 2023-03-29 NOTE — Lactation Note (Signed)
This note was copied from a baby's chart. Lactation Consultation Note  Patient Name: Natalie May [id number] Date: 03/29/2023 Age:28 hours Reason for consult: 1st time breastfeeding;Initial assessment;Term P1, term female infant. Birth Parent latched infant on her left breast using the football hold, prior to latch Birth Parent did reverse pressure softening to help evert nipples shaft out more. Infant sustained latch and breastfeed for 11 minutes. Birth Parent will continue to breastfeed infant according to hunger cues, on demand, 8 to 12+ times within 24 hours, skin to skin. Birth Parent knows to call RN/LC for further latch assistance if needed. LC discussed infant's input and output and infant had void diaper while LC was in the room. LC discussed the importance of maternal rest, diet and hydration. Birth Parent was  made aware of O/P services, breastfeeding support groups, community resources, and our phone # for post-discharge questions.    Maternal Data Has patient been taught Hand Expression?: Yes Does the patient have breastfeeding experience prior to this delivery?: No  Feeding Mother's Current Feeding Choice: Breast Milk and Formula  LATCH Score Latch: Grasps breast easily, tongue down, lips flanged, rhythmical sucking.  Audible Swallowing: A few with stimulation  Type of Nipple: Everted at rest and after stimulation (Short shafted did reverse pressure soften to help evert nipple out more prior to latch.)  Comfort (Breast/Nipple): Soft / non-tender  Hold (Positioning): Assistance needed to correctly position infant at breast and maintain latch.  LATCH Score: 8   Lactation Tools Discussed/Used    Interventions Interventions: Breast feeding basics reviewed;Assisted with latch;Support pillows;Adjust position;Skin to skin;Position options;Education;Breast massage;Expressed milk;Breast compression  Discharge Pump: DEBP;Personal  Consult Status Consult Status:  Follow-up Date: 03/30/23 Follow-up type: In-patient    Frederico Hamman 03/29/2023, 11:33 PM

## 2023-03-29 NOTE — Anesthesia Preprocedure Evaluation (Signed)
Anesthesia Evaluation  Patient identified by MRN, date of birth, ID band Patient awake    Reviewed: Allergy & Precautions, H&P , NPO status , Patient's Chart, lab work & pertinent test results, reviewed documented beta blocker date and time   Airway Mallampati: II  TM Distance: >3 FB Neck ROM: full    Dental no notable dental hx. (+) Teeth Intact, Dental Advisory Given   Pulmonary neg pulmonary ROS   Pulmonary exam normal breath sounds clear to auscultation       Cardiovascular hypertension, Normal cardiovascular exam Rhythm:regular Rate:Normal     Neuro/Psych  Headaches negative neurological ROS  negative psych ROS   GI/Hepatic negative GI ROS, Neg liver ROS,,,  Endo/Other    Morbid obesity  Renal/GU negative Renal ROS  negative genitourinary   Musculoskeletal   Abdominal  (+) + obese  Peds  Hematology negative hematology ROS (+)   Anesthesia Other Findings   Reproductive/Obstetrics (+) Pregnancy                             Anesthesia Physical Anesthesia Plan  ASA: 3  Anesthesia Plan: Epidural   Post-op Pain Management: Minimal or no pain anticipated   Induction: Intravenous  PONV Risk Score and Plan: 2  Airway Management Planned: Natural Airway  Additional Equipment:   Intra-op Plan:   Post-operative Plan:   Informed Consent: I have reviewed the patients History and Physical, chart, labs and discussed the procedure including the risks, benefits and alternatives for the proposed anesthesia with the patient or authorized representative who has indicated his/her understanding and acceptance.     Dental Advisory Given  Plan Discussed with:   Anesthesia Plan Comments: (Labs checked- platelets confirmed with RN in room. Fetal heart tracing, per RN, reported to be stable enough for sitting procedure. Discussed epidural, and patient consents to the procedure:  included risk of  possible headache,backache, failed block, allergic reaction, and nerve injury. This patient was asked if she had any questions or concerns before the procedure started.)       Anesthesia Quick Evaluation

## 2023-03-29 NOTE — MAU Provider Note (Signed)
History     CSN: [id number]  Arrival date and time: 03/29/23 1030   Event Date/Time   First Provider Initiated Contact with Patient 03/29/23 1104      Chief Complaint  Patient presents with   Contractions   HPI Natalie May is a 27 y.o. G1P0 at [redacted]w[redacted]d who presents to MAU for contractions and decreased fetal movement. She reports she has not felt baby move since last night around 8pm. Contractions started earlier this morning and are now every 2-4 minutes apart. She denies leaking fluid or vaginal bleeding.  Patient has been receiving PNC at [facility].   OB History     Gravida  1   Para  0   Term  0   Preterm  0   AB  0   Living  0      SAB  0   IAB  0   Ectopic  0   Multiple  0   Live Births  0           Past Medical History:  Diagnosis Date   Medical history non-contributory     Past Surgical History:  Procedure Laterality Date   FINGER SURGERY      Family History  Problem Relation Age of Onset   Asthma Mother    Asthma Maternal Aunt    Hypertension Maternal Grandmother    Diabetes Maternal Grandmother    Stroke Maternal Grandfather    Breast cancer Maternal Great-grandmother     Social History   Tobacco Use   Smoking status: Never   Smokeless tobacco: Never  Vaping Use   Vaping Use: Never used  Substance Use Topics   Alcohol use: Yes   Drug use: Never    Allergies: No Known Allergies  Medications Prior to Admission  Medication Sig Dispense Refill Last Dose   aspirin EC 81 MG tablet Take 1 tablet (81 mg total) by mouth daily. Swallow whole. 90 tablet 3 03/28/2023   cyclobenzaprine (FLEXERIL) 10 MG tablet Take 1 tablet (10 mg total) by mouth 3 (three) times daily as needed for muscle spasms. 30 tablet 2 Past Month   Prenatal Vit-Fe Fumarate-FA (PRENATAL MULTIVITAMIN) TABS tablet Take 1 tablet by mouth daily at 12 noon.   03/28/2023   Doxylamine-Pyridoxine (DICLEGIS) 10-10 MG TBEC Take 2 tablets by mouth at bedtime. If symptoms  persist, add one tablet in the morning and one in the afternoon (Patient not taking: Reported on 10/22/2022) 100 tablet 5    Review of Systems  Constitutional: Negative.   Gastrointestinal:  Positive for abdominal pain (contractions).  All other systems reviewed and are negative.  Physical Exam   Blood pressure (!) 142/85, pulse 88, temperature (!) 97.5 F (36.4 C), temperature source Oral, last menstrual period 06/28/2022, SpO2 98 %.  Physical Exam Vitals and nursing note reviewed.  Constitutional:      General: She is not in acute distress.    Appearance: She is obese.  Eyes:     Extraocular Movements: Extraocular movements intact.     Pupils: Pupils are equal, round, and reactive to light.  Cardiovascular:     Rate and Rhythm: Normal rate.  Pulmonary:     Effort: Pulmonary effort is normal.  Abdominal:     Palpations: Abdomen is soft.     Tenderness: There is no abdominal tenderness.     Comments: Gravid   Musculoskeletal:        General: Normal range of motion.     Cervical back:  Normal range of motion.     Right lower leg: Edema present.     Left lower leg: Edema present.  Skin:    General: Skin is warm and dry.  Neurological:     General: No focal deficit present.     Mental Status: She is alert and oriented to person, place, and time.  Psychiatric:        Mood and Affect: Mood normal.        Behavior: Behavior normal.    NST FHR: 135 bpm, moderate variability, +10x10 accels, no decels Toco: Q 2-40mins  MAU Course  Procedures  MDM NST  New onset elevated BPs. NST reassuring but not reactive.   Assessment and Plan  [redacted] weeks gestation of pregnancy Decreased fetal movement in third trimester Non-reactive NST Elevated blood pressure without diagnosis of hypertension  - Admit to L&D. Orders placed - L&D team notified  Brand Males, CNM 03/29/2023, 11:53 AM

## 2023-03-30 DIAGNOSIS — Z6791 Unspecified blood type, Rh negative: Secondary | ICD-10-CM

## 2023-03-30 DIAGNOSIS — O1400 Mild to moderate pre-eclampsia, unspecified trimester: Secondary | ICD-10-CM | POA: Clinically undetermined

## 2023-03-30 LAB — TYPE AND SCREEN: Antibody Screen: NEGATIVE

## 2023-03-30 LAB — RPR: RPR Ser Ql: NONREACTIVE

## 2023-03-30 MED ORDER — FUROSEMIDE 20 MG PO TABS
20.0000 mg | ORAL_TABLET | Freq: Every day | ORAL | Status: DC
  Administered 2023-03-30 – 2023-03-31 (×2): 20 mg via ORAL
  Filled 2023-03-30 (×2): qty 1

## 2023-03-30 MED ORDER — MEASLES, MUMPS & RUBELLA VAC IJ SOLR
0.5000 mL | Freq: Once | INTRAMUSCULAR | Status: AC
  Administered 2023-03-31: 0.5 mL via SUBCUTANEOUS
  Filled 2023-03-30: qty 0.5

## 2023-03-30 NOTE — Lactation Note (Signed)
This note was copied from a baby's chart. Lactation Consultation Note  Patient Name: Natalie May [id number] Date: 03/30/2023 Age:28 hours Reason for consult: Follow-up assessment;Primapara;1st time breastfeeding;Breastfeeding assistance;Term  LC assisted with latch per parent request. Noted difficulty latching and suck training demonstrated with expressed colostrum. Used manual pump with 21-mm for nipple eversion. Infant latched with ease. Infant is still breastfeeding upon LC leaving room.   Plan: 1-Feeding 8-12 times in 24h 2-Use manual pump for nipple eversion 3-Encouraged parent self-care.   Maternal Data Has patient been taught Hand Expression?: Yes Does the patient have breastfeeding experience prior to this delivery?: No  Feeding Mother's Current Feeding Choice: Breast Milk and Formula  LATCH Score Latch: Grasps breast easily, tongue down, lips flanged, rhythmical sucking.  Audible Swallowing: Spontaneous and intermittent  Type of Nipple: Everted at rest and after stimulation (very short shafted)  Comfort (Breast/Nipple): Soft / non-tender  Hold (Positioning): Assistance needed to correctly position infant at breast and maintain latch.  LATCH Score: 9   Lactation Tools Discussed/Used Tools: Pump;Flanges Flange Size: 21 Breast pump type: Manual Pump Education: Milk Storage;Setup, frequency, and cleaning Reason for Pumping: nipple eversion Pumping frequency: as needed prior to latch Pumped volume: 2 mL  Interventions Interventions: Breast feeding basics reviewed;Assisted with latch;Skin to skin;Breast massage;Hand express;Pre-pump if needed;Breast compression;Reverse pressure;Hand pump;Expressed milk;Coconut oil;Adjust position;Support pillows;Position options;Education  Discharge    Consult Status Consult Status: Follow-up Date: 03/31/23 Follow-up type: In-patient    Parsa Rickett A Higuera Ancidey 03/30/2023, 11:16 AM

## 2023-03-30 NOTE — Progress Notes (Signed)
Post Partum Day #1 Subjective: no complaints, up ad lib, and tolerating PO; breastfeeding going well; reviewed dx of pre-e without SF (elevated P/C resulted around time of delivery); BPs normal from 3hr window after delivery and overnight; unsure re contraception  Objective: Blood pressure 110/72, pulse 95, temperature 97.6 F (36.4 C), temperature source Oral, resp. rate 18, last menstrual period 06/28/2022, SpO2 98 %, unknown if currently breastfeeding.  Physical Exam:  General: alert, cooperative, and no distress Lochia: appropriate Uterine Fundus: firm DVT Evaluation: No evidence of DVT seen on physical exam.  Recent Labs    03/29/23 1139  HGB 11.1*  HCT 35.1*    Assessment/Plan: Plan for discharge tomorrow Lasix 20mg  x 5d MMR ordered   LOS: 1 day   Natalie May, CNM 03/30/2023, 11:16 AM

## 2023-03-30 NOTE — Anesthesia Postprocedure Evaluation (Signed)
Anesthesia Post Note  Patient: [employer]  Procedure(s) Performed: AN AD HOC LABOR EPIDURAL     Patient location during evaluation: Mother Baby Anesthesia Type: Epidural Level of consciousness: awake and alert Pain management: pain level controlled Vital Signs Assessment: post-procedure vital signs reviewed and stable Respiratory status: spontaneous breathing, nonlabored ventilation and respiratory function stable Cardiovascular status: stable Postop Assessment: no headache, no backache and epidural receding Anesthetic complications: no  No notable events documented.  Last Vitals:  Vitals:   03/30/23 0335 03/30/23 0730  BP: 130/77 110/72  Pulse: 96 95  Resp: 17 18  Temp: 36.6 C 36.4 C  SpO2: 96% 98%    Last Pain:  Vitals:   03/30/23 0730  TempSrc: Oral  PainSc: 0-No pain   Pain Goal:                   Margalit Leece N

## 2023-03-31 MED ORDER — IBUPROFEN 600 MG PO TABS: 600.0000 mg | ORAL_TABLET | Freq: Four times a day (QID) | ORAL | 0 refills | Status: AC | PRN

## 2023-03-31 NOTE — Lactation Note (Addendum)
This note was copied from a baby's chart. Lactation Consultation Note  Patient Name: Natalie May [id number] Date: 03/31/2023 Age:28 hours Reason for consult: Follow-up assessment   P1, Mother started off breastfeeding but per the mother her nipples became sore so she stopped and has been primarily formula feeding.  She has been using personal nipple balm.  Discussed breastfeeding basics and encouraged getting OP assistance with latching.  Reminder mother to pump at least q 3 hours until baby is latching.  Provided milk storage guidelines and formula preparation information. Reviewed engorgement care and monitoring voids/stools.'  Maternal Data Has patient been taught Hand Expression?: Yes  Feeding Mother's Current Feeding Choice: Breast Milk and Formula   Lactation Tools Discussed/Used  DEBP  Interventions Interventions: Breast feeding basics reviewed;DEBP;Education  Discharge Discharge Education: Engorgement and breast care;Warning signs for feeding baby;Outpatient recommendation Pump: Personal  Consult Status Consult Status: Complete Date: 03/31/23    Dahlia Byes [facility] 03/31/2023, 1:43 PM

## 2023-04-01 ENCOUNTER — Ambulatory Visit: Payer: BC Managed Care – PPO

## 2023-04-01 ENCOUNTER — Encounter: Payer: BC Managed Care – PPO | Admitting: Obstetrics & Gynecology

## 2023-04-02 ENCOUNTER — Ambulatory Visit: Payer: BC Managed Care – PPO | Attending: Obstetrics and Gynecology | Admitting: Physical Therapy

## 2023-04-02 ENCOUNTER — Encounter: Payer: Self-pay | Admitting: Physical Therapy

## 2023-04-02 ENCOUNTER — Other Ambulatory Visit: Payer: Self-pay

## 2023-04-02 DIAGNOSIS — M6281 Muscle weakness (generalized): Secondary | ICD-10-CM | POA: Insufficient documentation

## 2023-04-02 DIAGNOSIS — M5431 Sciatica, right side: Secondary | ICD-10-CM | POA: Insufficient documentation

## 2023-04-02 DIAGNOSIS — M62838 Other muscle spasm: Secondary | ICD-10-CM | POA: Insufficient documentation

## 2023-04-02 DIAGNOSIS — R293 Abnormal posture: Secondary | ICD-10-CM | POA: Insufficient documentation

## 2023-04-02 DIAGNOSIS — O0990 Supervision of high risk pregnancy, unspecified, unspecified trimester: Secondary | ICD-10-CM | POA: Insufficient documentation

## 2023-04-02 NOTE — Therapy (Deleted)
OUTPATIENT PHYSICAL THERAPY FEMALE PELVIC EVALUATION   Patient Name: Natalie May MRN: [id number] DOB:[birth year], 27 y.o., female Today's Date: 04/02/2023  END OF SESSION:   Past Medical History:  Diagnosis Date   Medical history non-contributory    Past Surgical History:  Procedure Laterality Date   FINGER SURGERY     Patient Active Problem List   Diagnosis Date Noted   Pre-eclampsia, mild 03/30/2023   Rh negative state in antepartum period 03/30/2023   Indication for care in labor or delivery 03/29/2023   BMI 45.0-49.9, adult (HCC) 10/22/2022   Alpha thalassemia silent carrier 10/22/2022   SMA carrier increased risk 10/22/2022   Rubella non-immune status, antepartum 09/28/2022   Prediabetes 09/28/2022   Supervision of high risk pregnancy, antepartum 08/28/2022   Maternal morbid obesity, antepartum (HCC) 06/22/2021   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  Rationale for Evaluation and Treatment: Rehabilitation  ONSET DATE: ***  SUBJECTIVE:                                                                                                                                                                                           SUBJECTIVE STATEMENT: *** Fluid intake: {Yes/No:304960894}   PAIN:  Are you having pain? {yes/no:20286} NPRS scale: ***/10 Pain location: {pelvic pain location:27098}  Pain type: {type:313116} Pain description: {PAIN DESCRIPTION:21022940}   Aggravating factors: *** Relieving factors: ***  PRECAUTIONS: {Therapy precautions:24002}  WEIGHT BEARING RESTRICTIONS: {Yes ***/No:24003}  FALLS:  Has patient fallen in last 6 months? {fallsyesno:27318}  LIVING  ENVIRONMENT: Lives with: {[facility] lives with:25569::"lives with their family"} Lives in: {Lives in:25570} Stairs: {opstairs:27293} Has following equipment at home: {Assistive devices:23999}  OCCUPATION: ***  PLOF: {PLOF:24004}  PATIENT GOALS: ***  PERTINENT HISTORY:  *** Sexual abuse: {Yes/No:304960894}  BOWEL MOVEMENT: Pain with bowel movement: {yes/no:20286} Type of bowel movement:{PT BM type:27100} Fully empty rectum: {Yes/No:304960894} Leakage: {Yes/No:304960894} Pads: {Yes/No:304960894} Fiber supplement: {Yes/No:304960894}  URINATION: Pain with urination: {yes/no:20286} Fully empty bladder: {Yes/No:304960894} Stream: {PT urination:27102} Urgency: {Yes/No:304960894} Frequency: *** Leakage: {PT leakage:27103} Pads: {Yes/No:304960894}  INTERCOURSE: Pain with intercourse: {pain with intercourse PA:27099} Ability to have vaginal penetration:  {Yes/No:304960894} Climax: *** Marinoff Scale: ***/3  PREGNANCY: Vaginal deliveries *** Tearing {Yes***/No:304960894} C-section deliveries *** Currently pregnant {Yes***/No:304960894}  PROLAPSE: {PT prolapse:27101}   OBJECTIVE:   DIAGNOSTIC FINDINGS:  ***  PATIENT SURVEYS:  {rehab surveys:24030}  PFIQ-7 ***  COGNITION: Overall cognitive status: {cognition:24006}     SENSATION: Light touch: {intact/deficits:24005} Proprioception: {intact/deficits:24005}  MUSCLE LENGTH: Hamstrings: Right *** deg; Left *** deg Thomas test: Right *** deg; Left *** deg  LUMBAR SPECIAL TESTS:  {lumbar special test:25242}  FUNCTIONAL TESTS:  {Functional tests:24029}  GAIT: Distance walked: *** Assistive device utilized: {Assistive devices:23999} Level of assistance: {Levels of assistance:24026} Comments: ***  POSTURE: {posture:25561}  PELVIC ALIGNMENT:  LUMBARAROM/PROM:  A/PROM A/PROM  eval  Flexion   Extension   Right lateral flexion   Left lateral flexion   Right rotation   Left rotation    (Blank rows = not  tested)  LOWER EXTREMITY ROM:  {AROM/PROM:27142} ROM Right eval Left eval  Hip flexion    Hip extension    Hip abduction    Hip adduction    Hip internal rotation    Hip external rotation    Knee flexion    Knee extension    Ankle dorsiflexion    Ankle plantarflexion    Ankle inversion    Ankle eversion     (Blank rows = not tested)  LOWER EXTREMITY MMT:  MMT Right eval Left eval  Hip flexion    Hip extension    Hip abduction    Hip adduction    Hip internal rotation    Hip external rotation    Knee flexion    Knee extension    Ankle dorsiflexion    Ankle plantarflexion    Ankle inversion    Ankle eversion     PALPATION:   General  ***                External Perineal Exam ***                             Internal Pelvic Floor ***  Patient confirms identification and approves PT to assess internal pelvic floor and treatment {yes/no:20286}  PELVIC MMT:   MMT eval  Vaginal   Internal Anal Sphincter   External Anal Sphincter   Puborectalis   Diastasis Recti   (Blank rows = not tested)        TONE: ***  PROLAPSE: ***  TODAY'S TREATMENT:                                                                                                                              DATE: ***  EVAL ***   PATIENT EDUCATION:  Education details: *** Person educated: {Person educated:25204} Education method: {Education Method:25205} Education comprehension: {Education Comprehension:25206}  HOME EXERCISE PROGRAM: ***  ASSESSMENT:  CLINICAL IMPRESSION: Patient is a *** y.o. *** who was seen today for physical therapy evaluation and treatment for ***.   OBJECTIVE IMPAIRMENTS: {opptimpairments:25111}.   ACTIVITY LIMITATIONS: {activitylimitations:27494}  PARTICIPATION LIMITATIONS: {participationrestrictions:25113}  PERSONAL FACTORS: {Personal factors:25162} are also affecting patient's functional outcome.   REHAB POTENTIAL: {rehabpotential:25112}  CLINICAL  DECISION MAKING: {clinical decision making:25114}  EVALUATION COMPLEXITY: {Evaluation complexity:25115}   GOALS: Goals reviewed with patient? {yes/no:20286}  SHORT TERM GOALS: Target date: ***  *** Baseline: Goal status: INITIAL  2.  *** Baseline:  Goal status: INITIAL  3.  *** Baseline:  Goal status: INITIAL  4.  *** Baseline:  Goal status: INITIAL  5.  *** Baseline:  Goal status: INITIAL  6.  *** Baseline:  Goal status: INITIAL  LONG TERM GOALS: Target date: ***  *** Baseline:  Goal status: INITIAL  2.  *** Baseline:  Goal status: INITIAL  3.  *** Baseline:  Goal status: INITIAL  4.  *** Baseline:  Goal status: INITIAL  5.  *** Baseline:  Goal status: INITIAL  6.  *** Baseline:  Goal status: INITIAL  PLAN:  PT FREQUENCY: {rehab frequency:25116}  PT DURATION: {rehab duration:25117}  PLANNED INTERVENTIONS: {rehab planned interventions:25118::"Therapeutic exercises","Therapeutic activity","Neuromuscular re-education","Balance training","Gait training","Patient/Family education","Self Care","Joint mobilization"}  PLAN FOR NEXT SESSION: ***   Brayton Caves Rasaan Brotherton, PT 04/02/2023, 7:49 AM

## 2023-04-02 NOTE — Therapy (Signed)
OUTPATIENT PHYSICAL THERAPY THORACOLUMBAR EVALUATION   Patient Name: Natalie May MRN: [id number] DOB:1996/01/28, 28 y.o., female Today's Date: 04/03/2023  END OF SESSION:  PT End of Session - 04/02/23 1542     Visit Number 1    Date for PT Re-Evaluation 06/25/23    Authorization Type BCBS    PT Start Time 1532    PT Stop Time 1615    PT Time Calculation (min) 43 min    Activity Tolerance Patient tolerated treatment well    Behavior During Therapy [facility] for tasks assessed/performed             Past Medical History:  Diagnosis Date   Medical history non-contributory    Past Surgical History:  Procedure Laterality Date   FINGER SURGERY     Patient Active Problem List   Diagnosis Date Noted   Pre-eclampsia, mild 03/30/2023   Rh negative state in antepartum period 03/30/2023   Indication for care in labor or delivery 03/29/2023   BMI 45.0-49.9, adult (HCC) 10/22/2022   Alpha thalassemia silent carrier 10/22/2022   SMA carrier increased risk 10/22/2022   Rubella non-immune status, antepartum 09/28/2022   Prediabetes 09/28/2022   Supervision of high risk pregnancy, antepartum 08/28/2022   Maternal morbid obesity, antepartum (HCC) 06/22/2021   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    PCP: general  REFERRING PROVIDER: [place] Dezza Bing, MD   REFERRING DIAG:  O09.90 (ICD-10-CM) - Supervision of high risk pregnancy, antepartum  M54.31 (ICD-10-CM) - Right sciatic nerve pain    Rationale for Evaluation and Treatment: Rehabilitation  THERAPY DIAG:  Muscle weakness (generalized)  Abnormal posture  Other muscle spasm  ONSET DATE: April 2024  SUBJECTIVE:                                                                                                                                                                                           SUBJECTIVE STATEMENT: Have been having this pain since April. Started during  pregnancy and I thought it would go away  PERTINENT HISTORY:  Post partum 03/29/23 and was vaginal delivery  PAIN:  PAIN:  Are you having pain? Yes NPRS scale: 5/10 Pain location: groin around to hip and down leg to foot Pain orientation: Right  PAIN TYPE: sharp and shooting Pain description: intermittent  Aggravating factors: putting weight to walk, going up steps, lying on the Rt side Relieving factors: sometimes sitting up is okay  PRECAUTIONS: None  WEIGHT BEARING RESTRICTIONS: No  FALLS:  Has patient fallen in last 6 months? No  LIVING ENVIRONMENT: Lives with: lives with their spouse and infant  Lives in: House/apartment   OCCUPATION: maternity leave currently  PLOF: Independent  PATIENT GOALS: get rid of the pain  NEXT MD VISIT: 6 week follow up  OBJECTIVE:   DIAGNOSTIC FINDINGS:    PATIENT SURVEYS:    SCREENING FOR RED FLAGS: Bowel or bladder incontinence: No Spinal tumors: No Cauda equina syndrome: No Compression fracture: No Abdominal aneurysm: No  COGNITION: Overall cognitive status: Within functional limits for tasks assessed     SENSATION: WFL  MUSCLE LENGTH: Hamstrings: Right ~40 deg +pain in buttock; Left ~60 deg +pain in Rt SI    POSTURE: increased lumbar lordosis, anterior pelvic tilt, right pelvic obliquity, and weight shift left  PALPATION: Tight and TTP lumbar Rt gluteals and around greater trochanter and inguinal ligament on Rt; Tight and tender to palpation Rt adductors - very tender at attachment to pelvis  Core - low muscle tone and uses valsalva  Rt anterior pelvic rotation Pelvic compression in sidelying provides some support  LUMBAR ROM:   AROM eval  Flexion 50%  Extension   Right lateral flexion   Left lateral flexion   Right rotation   Left rotation    (Blank rows = not tested)  LOWER EXTREMITY ROM:     Passive  Right eval Left eval  Hip flexion 25% +P   Hip extension    Hip abduction 25% +P   Hip  adduction 25% +P   Hip internal rotation 25% +P   Hip external rotation 25% +P   Knee flexion    Knee extension    Ankle dorsiflexion    Ankle plantarflexion    Ankle inversion    Ankle eversion     (Blank rows = not tested)  LOWER EXTREMITY MMT:  pain present with everything and only tested Rt side due to positional limitations and high level of pain  MMT Right eval Left eval  Hip flexion 2/5   Hip extension    Hip abduction 2/5   Hip adduction 4/5   Hip internal rotation    Hip external rotation    Knee flexion    Knee extension    Ankle dorsiflexion    Ankle plantarflexion    Ankle inversion    Ankle eversion     (Blank rows = not tested)  LUMBAR SPECIAL TESTS:  Trendelenburg sign: Positive Rt +pain; pain also Rt side when standing on Lt LE  FUNCTIONAL TESTS:  Sit to stand - shifting weight to the left Bed mobility slow and holding breath  GAIT: Distance walked: to and from waiting room to room 4 Assistive device utilized: Single point cane Level of assistance: Modified independence Comments: antalgic on Rt side even with cane - holding cane in left hand  TODAY'S TREATMENT:                                                                                                                              DATE: 04/02/23  Educated on  SI loc or other SI belt, exhale with bed mobility to engage core MET to Rt hip - extension 3x20 sec hold - did not increase pain but only able to do with hip in about 60 deg flexion due to pain  PATIENT EDUCATION:  Education details: initial plan of care Person educated: Patient Education method: Explanation Education comprehension: verbalized understanding  HOME EXERCISE PROGRAM: Not issued today  ASSESSMENT:  CLINICAL IMPRESSION: Patient is a 27 y.o. female recently post partum who was seen today for physical therapy evaluation and treatment for sciatica. Pt is very painful and guarding Rt LE, has a difficult time relaxing in supine  keeping knee and hip slightly flexed and ER, difficulty with any passive ROM of that Rt side.  Pt has tenderness around Rt SI joint, Rt adductors, Rt greater trochanter and inguinal ligament on Rt.  Pt shifts weight to the left to stand from sitting and very slow and holding breathing with transfer and bed mobility.  Pt ambulating with SPC in the left hand and gait is slow and antalgic.  Pt has pain in Rt buttock that shoots down the leg to toes, no numbness or tingling.  Pt also getting Rt inguinal pain that states wraps around to the Rt buttock.  Rt ilium is anteriorly rotated and MMT is weak secondarily due to pain.  Pelvic compression may feel a little better and pt educated on SI belt due to their core being weak and assist with pelvic stability.  Pt will benefit from skilled PT for pain management and strengthening of hip and core.  OBJECTIVE IMPAIRMENTS: decreased coordination, decreased endurance, difficulty walking, decreased ROM, decreased strength, increased muscle spasms, impaired flexibility, impaired tone, postural dysfunction, and pain.   ACTIVITY LIMITATIONS: carrying, lifting, bending, standing, sleeping, transfers, bed mobility, and caring for others  PARTICIPATION LIMITATIONS: cleaning, laundry, interpersonal relationship, community activity, and occupation  PERSONAL FACTORS: Time since onset of injury/illness/exacerbation and 1 comorbidity: recently post partum  are also affecting patient's functional outcome.   REHAB POTENTIAL: Excellent  CLINICAL DECISION MAKING: Evolving/moderate complexity  EVALUATION COMPLEXITY: Moderate   GOALS: Goals reviewed with patient? Yes  SHORT TERM GOALS: Target date: 05/01/23  Ind with initial HEP Baseline: Goal status: INITIAL  2.  Pt able to walk with even stride and LRAD due to improved pain management and core and hip strength Baseline:  Goal status: INITIAL   LONG TERM GOALS: Target date: 06/25/23  Pt will be independent with  advanced HEP to maintain improvements made throughout therapy  Baseline:  Goal status: INITIAL  2.  Pt will report 75% reduction of pain due to improvements in posture, strength, and muscle length  Baseline:  Goal status: INITIAL  3.  Pt will demonstrate 5/5 bil hip strength for improved functional activities such as gait without assistive device and pain free. Baseline:  Goal status: INITIAL  4.  Pt will be able to lift at least 10 lb correctly for 10 reps without pain or uneven weight distribution for return to all functional activities  Baseline:  Goal status: INITIAL   PLAN:  PT FREQUENCY: 1-2x/week  PT DURATION: 12 weeks  PLANNED INTERVENTIONS: Therapeutic exercises, Therapeutic activity, Neuromuscular re-education, Balance training, Gait training, Patient/Family education, Self Care, Joint mobilization, Aquatic Therapy, Dry Needling, Electrical stimulation, Cryotherapy, Moist heat, scar mobilization, Taping, Traction, Biofeedback, Manual therapy, and Re-evaluation.  PLAN FOR NEXT SESSION: Pt may need pelvic assessment but not currently having bowel or bladder symptoms and is too soon post partum to  tell - treating as sciatica for now.  Manual to glutes and lumbar, IFC stim for pain management possibly, f/u on SI belt, core strength and coordination of movements with exhale   Brayton Caves Fady Stamps, PT 04/03/2023, 7:57 AM

## 2023-04-03 ENCOUNTER — Inpatient Hospital Stay (HOSPITAL_COMMUNITY): Payer: BC Managed Care – PPO

## 2023-04-03 ENCOUNTER — Inpatient Hospital Stay (HOSPITAL_COMMUNITY): Admission: RE | Admit: 2023-04-03 | Payer: BC Managed Care – PPO | Source: Home / Self Care | Admitting: Family Medicine

## 2023-04-08 ENCOUNTER — Ambulatory Visit (INDEPENDENT_AMBULATORY_CARE_PROVIDER_SITE_OTHER): Payer: BC Managed Care – PPO | Admitting: *Deleted

## 2023-04-08 VITALS — BP 122/83 | HR 90

## 2023-04-08 DIAGNOSIS — O1403 Mild to moderate pre-eclampsia, third trimester: Secondary | ICD-10-CM

## 2023-04-08 DIAGNOSIS — Z3A38 38 weeks gestation of pregnancy: Secondary | ICD-10-CM

## 2023-04-08 MED ORDER — FUROSEMIDE 20 MG PO TABS: 20.0000 mg | ORAL_TABLET | Freq: Every day | ORAL | 0 refills | Status: DC

## 2023-04-08 NOTE — Progress Notes (Signed)
Subjective:  Natalie May is a 27 y.o. female here for BP check.   Hypertension ROS: Patient denies any headaches, visual symptoms, RUQ/epigastric pain or other concerning symptoms.  Pt also states she never received the lasix after discharge.   Objective:  BP 122/83   Pulse 90   LMP 06/28/2022   Breastfeeding Yes   Appearance alert, well appearing, and in no distress. General exam BP noted to be well controlled today in office.    Assessment:   Blood Pressure well controlled.   Plan:  Per Dr Vergie Living okay to send in the lasix x 3 days. Follow up as needed and or at postpartum visit.   Scheryl Marten, RN  .

## 2023-04-12 NOTE — Progress Notes (Signed)
Patient was assessed and managed by nursing staff during this encounter. I have reviewed the chart and agree with the documentation and plan. I have also made any necessary editorial changes.  Jantzen Pilger, MD 04/12/2023 3:48 PM   

## 2023-04-17 NOTE — Therapy (Addendum)
OUTPATIENT PHYSICAL THERAPY THORACOLUMBAR TREATMENT   Patient Name: Natalie May MRN: [id number] DOB:Aug 06, 1996, 27 y.o., female Today's Date: 04/18/2023  END OF SESSION:  PT End of Session - 04/18/23 1201     Visit Number 2    Date for PT Re-Evaluation 06/25/23    Authorization Type BCBS    PT Start Time 1153   pt late   PT Stop Time 1230    PT Time Calculation (min) 37 min    Activity Tolerance Patient tolerated treatment well    Behavior During Therapy [facility] for tasks assessed/performed              Past Medical History:  Diagnosis Date   Medical history non-contributory    Past Surgical History:  Procedure Laterality Date   FINGER SURGERY     Patient Active Problem List   Diagnosis Date Noted   Pre-eclampsia, mild 03/30/2023   Rh negative state in antepartum period 03/30/2023   Indication for care in labor or delivery 03/29/2023   BMI 45.0-49.9, adult (HCC) 10/22/2022   Alpha thalassemia silent carrier 10/22/2022   SMA carrier increased risk 10/22/2022   Rubella non-immune status, antepartum 09/28/2022   Prediabetes 09/28/2022   Supervision of high risk pregnancy, antepartum 08/28/2022   Maternal morbid obesity, antepartum (HCC) 06/22/2021   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    PCP: general  REFERRING PROVIDER: [place] Bing, MD   REFERRING DIAG:  O09.90 (ICD-10-CM) - Supervision of high risk pregnancy, antepartum  M54.31 (ICD-10-CM) - Right sciatic nerve pain    Rationale for Evaluation and Treatment: Rehabilitation  THERAPY DIAG:  Muscle weakness (generalized)  Abnormal posture  Other muscle spasm  ONSET DATE: April 2024  SUBJECTIVE:                                                                                                                                                                                           SUBJECTIVE STATEMENT: Saw chiropractor and she will be seeing her 3x/wk and  will be back here 10/25/22. She ordered the SI belt but it was too small. Hasn't reordered yet  PERTINENT HISTORY:  Post partum 03/29/23 and was vaginal delivery  PAIN:  PAIN:  Are you having pain? Yes NPRS scale: 5/10 Pain location: groin around to hip and down leg to foot Pain orientation: Right  PAIN TYPE: sharp and shooting Pain description: intermittent  Aggravating factors: putting weight to walk, going up steps, lying on the Rt side Relieving factors: sometimes sitting up is okay  PRECAUTIONS: None  WEIGHT BEARING RESTRICTIONS: No  FALLS:  Has patient fallen in  last 6 months? No  LIVING ENVIRONMENT: Lives with: lives with their spouse and infant Lives in: House/apartment   OCCUPATION: maternity leave currently  PLOF: Independent  PATIENT GOALS: get rid of the pain  NEXT MD VISIT: 6 week follow up  OBJECTIVE:   DIAGNOSTIC FINDINGS:    PATIENT SURVEYS:    SCREENING FOR RED FLAGS: Bowel or bladder incontinence: No Spinal tumors: No Cauda equina syndrome: No Compression fracture: No Abdominal aneurysm: No  COGNITION: Overall cognitive status: Within functional limits for tasks assessed     SENSATION: WFL  MUSCLE LENGTH: Hamstrings: Right ~40 deg +pain in buttock; Left ~60 deg +pain in Rt SI    POSTURE: increased lumbar lordosis, anterior pelvic tilt, right pelvic obliquity, and weight shift left  PALPATION: Tight and TTP lumbar Rt gluteals and around greater trochanter and inguinal ligament on Rt; Tight and tender to palpation Rt adductors - very tender at attachment to pelvis  Core - low muscle tone and uses valsalva  Rt anterior pelvic rotation Pelvic compression in sidelying provides some support  LUMBAR ROM:   AROM eval  Flexion 50%  Extension   Right lateral flexion   Left lateral flexion   Right rotation   Left rotation    (Blank rows = not tested)  LOWER EXTREMITY ROM:     Passive  Right eval Left eval  Hip flexion 25% +P    Hip extension    Hip abduction 25% +P   Hip adduction 25% +P   Hip internal rotation 25% +P   Hip external rotation 25% +P   Knee flexion    Knee extension    Ankle dorsiflexion    Ankle plantarflexion    Ankle inversion    Ankle eversion     (Blank rows = not tested)  LOWER EXTREMITY MMT:  pain present with everything and only tested Rt side due to positional limitations and high level of pain  MMT Right eval Left eval  Hip flexion 2/5   Hip extension    Hip abduction 2/5   Hip adduction 4/5   Hip internal rotation    Hip external rotation    Knee flexion    Knee extension    Ankle dorsiflexion    Ankle plantarflexion    Ankle inversion    Ankle eversion     (Blank rows = not tested)  LUMBAR SPECIAL TESTS:  Trendelenburg sign: Positive Rt +pain; pain also Rt side when standing on Lt LE  FUNCTIONAL TESTS:  Sit to stand - shifting weight to the left Bed mobility slow and holding breath  GAIT: Distance walked: to and from waiting room to room 4 Assistive device utilized: Single point cane Level of assistance: Modified independence Comments: antalgic on Rt side even with cane - holding cane in left hand  TODAY'S TREATMENT:  DATE: 04/18/23  Assessed pelvis; R ilia anteriorly rotated MET to correct ant innominate Manual: TPR and STM to R gluteals, QL and piriformis Prone pelvic press 5 x 5 sec, with unilateral knee bend x 5 ea, B knee bend x 5, hip ext x 5 L/R, mule kick x 5 L/R MET again to correct rotation HEP issued     DATE: 04/02/23  Educated on SI loc or other SI belt, exhale with bed mobility to engage core MET to Rt hip - extension 3x20 sec hold - did not increase pain but only able to do with hip in about 60 deg flexion due to pain  PATIENT EDUCATION:  Education details: initial plan of care Person educated: Patient Education  method: Explanation Education comprehension: verbalized understanding  HOME EXERCISE PROGRAM: Access Code: [email or web address] URL: [email or web address] Date: 04/18/2023 Prepared by: Raynelle Fanning  Exercises - MET for Ant Rotation  - 2 x daily - 7 x weekly - 1 sets - 3 reps - 10 sec hold - Prone Hip Extension  - 1 x daily - 3-4 x weekly - 1-2 sets - 10 reps - Prone Hip Extension with Bent Knee  - 1 x daily - 3-4 x weekly - 1-2 sets - 10 reps  ASSESSMENT:  CLINICAL IMPRESSION: Ameshia 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 She was able to self correct with MET. She tolerated manual and therex without increased pain today, but pelvis was again out of alignment at end of session. Re- corrected with MET and initiated HEP. She continues to demonstrate potential for improvement and would benefit from continued skilled therapy to address impairments.   OBJECTIVE IMPAIRMENTS: decreased coordination, decreased endurance, difficulty walking, decreased ROM, decreased strength, increased muscle spasms, impaired flexibility, impaired tone, postural dysfunction, and pain.   ACTIVITY LIMITATIONS: carrying, lifting, bending, standing, sleeping, transfers, bed mobility, and caring for others  PARTICIPATION LIMITATIONS: cleaning, laundry, interpersonal relationship, community activity, and occupation  PERSONAL FACTORS: Time since onset of injury/illness/exacerbation and 1 comorbidity: recently post partum  are also affecting patient's functional outcome.   REHAB POTENTIAL: Excellent  CLINICAL DECISION MAKING: Evolving/moderate complexity  EVALUATION COMPLEXITY: Moderate   GOALS: Goals reviewed with patient? Yes  SHORT TERM GOALS: Target date: 05/01/23  Ind with initial HEP Baseline: Goal status: INITIAL  2.  Pt able to walk with even stride and LRAD due to improved pain management and core and hip strength Baseline:  Goal status: INITIAL   LONG TERM GOALS: Target date: 06/25/23  Pt  will be independent with advanced HEP to maintain improvements made throughout therapy  Baseline:  Goal status: INITIAL  2.  Pt will report 75% reduction of pain due to improvements in posture, strength, and muscle length  Baseline:  Goal status: INITIAL  3.  Pt will demonstrate 5/5 bil hip strength for improved functional activities such as gait without assistive device and pain free. Baseline:  Goal status: INITIAL  4.  Pt will be able to lift at least 10 lb correctly for 10 reps without pain or uneven weight distribution for return to all functional activities  Baseline:  Goal status: INITIAL   PLAN:  PT FREQUENCY: 1-2x/week  PT DURATION: 12 weeks  PLANNED INTERVENTIONS: Therapeutic exercises, Therapeutic activity, Neuromuscular re-education, Balance training, Gait training, Patient/Family education, Self Care, Joint mobilization, Aquatic Therapy, Dry Needling, Electrical stimulation, Cryotherapy, Moist heat, scar mobilization, Taping, Traction, Biofeedback, Manual therapy, and Re-evaluation.  PLAN FOR NEXT SESSION: Pt may need pelvic assessment but not currently having bowel or bladder  symptoms and is too soon post partum to tell - treating as sciatica for now.  Manual to glutes and lumbar, IFC stim for pain management possibly, f/u on SI belt, core strength and coordination of movements with exhale  Solon Palm, PT  04/18/2023, 1:21 PM [facility] [address] [place], [place] [address] Phone # [phone] Fax [phone]

## 2023-04-18 ENCOUNTER — Ambulatory Visit: Payer: BC Managed Care – PPO | Attending: Obstetrics and Gynecology | Admitting: Physical Therapy

## 2023-04-18 ENCOUNTER — Encounter: Payer: Self-pay | Admitting: Physical Therapy

## 2023-04-18 DIAGNOSIS — M62838 Other muscle spasm: Secondary | ICD-10-CM | POA: Diagnosis present

## 2023-04-18 DIAGNOSIS — R293 Abnormal posture: Secondary | ICD-10-CM | POA: Diagnosis present

## 2023-04-18 DIAGNOSIS — M6281 Muscle weakness (generalized): Secondary | ICD-10-CM

## 2023-04-24 NOTE — Therapy (Signed)
OUTPATIENT PHYSICAL THERAPY THORACOLUMBAR TREATMENT   Patient Name: Natalie May MRN: [id number] DOB:25-Sep-1996, 27 y.o., female Today's Date: 04/25/2023  END OF SESSION:  PT End of Session - 04/25/23 1152     Visit Number 3    Date for PT Re-Evaluation 06/25/23    Authorization Type BCBS    PT Start Time 1152    PT Stop Time 1230    PT Time Calculation (min) 38 min    Activity Tolerance Patient tolerated treatment well    Behavior During Therapy [facility] for tasks assessed/performed               Past Medical History:  Diagnosis Date   Medical history non-contributory    Past Surgical History:  Procedure Laterality Date   FINGER SURGERY     Patient Active Problem List   Diagnosis Date Noted   Pre-eclampsia, mild 03/30/2023   Rh negative state in antepartum period 03/30/2023   Indication for care in labor or delivery 03/29/2023   BMI 45.0-49.9, adult (HCC) 10/22/2022   Alpha thalassemia silent carrier 10/22/2022   SMA carrier increased risk 10/22/2022   Rubella non-immune status, antepartum 09/28/2022   Prediabetes 09/28/2022   Supervision of high risk pregnancy, antepartum 08/28/2022   Maternal morbid obesity, antepartum (HCC) 06/22/2021   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    PCP: general  REFERRING PROVIDER: Montrose Bing, MD   REFERRING DIAG:  O09.90 (ICD-10-CM) - Supervision of high risk pregnancy, antepartum  M54.31 (ICD-10-CM) - Right sciatic nerve pain    Rationale for Evaluation and Treatment: Rehabilitation  THERAPY DIAG:  Muscle weakness (generalized)  Abnormal posture  Other muscle spasm  ONSET DATE: April 2024  SUBJECTIVE:                                                                                                                                                                                           SUBJECTIVE STATEMENT: When I'm going up stairs it's in my knee now. 3/10 pain in R  hip today.  PERTINENT HISTORY:  Post partum 03/29/23 and was vaginal delivery  PAIN:  PAIN:  Are you having pain? Yes NPRS scale: 3/10 Pain location: groin around to hip and down leg to foot Pain orientation: Right  PAIN TYPE: sharp and shooting Pain description: intermittent  Aggravating factors: putting weight to walk, going up steps, lying on the Rt side Relieving factors: sometimes sitting up is okay  PRECAUTIONS: None  WEIGHT BEARING RESTRICTIONS: No  FALLS:  Has patient fallen in last 6 months? No  LIVING ENVIRONMENT: Lives with: lives with their spouse and  infant Lives in: House/apartment   OCCUPATION: maternity leave currently  PLOF: Independent  PATIENT GOALS: get rid of the pain  NEXT MD VISIT: 6 week follow up  OBJECTIVE:   DIAGNOSTIC FINDINGS:    PATIENT SURVEYS:    SCREENING FOR RED FLAGS: Bowel or bladder incontinence: No Spinal tumors: No Cauda equina syndrome: No Compression fracture: No Abdominal aneurysm: No  COGNITION: Overall cognitive status: Within functional limits for tasks assessed     SENSATION: WFL  MUSCLE LENGTH: Hamstrings: Right ~40 deg +pain in buttock; Left ~60 deg +pain in Rt SI    POSTURE: increased lumbar lordosis, anterior pelvic tilt, right pelvic obliquity, and weight shift left  PALPATION: Tight and TTP lumbar Rt gluteals and around greater trochanter and inguinal ligament on Rt; Tight and tender to palpation Rt adductors - very tender at attachment to pelvis  Core - low muscle tone and uses valsalva  Rt anterior pelvic rotation Pelvic compression in sidelying provides some support  LUMBAR ROM:   AROM eval  Flexion 50%  Extension   Right lateral flexion   Left lateral flexion   Right rotation   Left rotation    (Blank rows = not tested)  LOWER EXTREMITY ROM:     Passive  Right eval Left eval  Hip flexion 25% +P   Hip extension    Hip abduction 25% +P   Hip adduction 25% +P   Hip internal  rotation 25% +P   Hip external rotation 25% +P   Knee flexion    Knee extension    Ankle dorsiflexion    Ankle plantarflexion    Ankle inversion    Ankle eversion     (Blank rows = not tested)  LOWER EXTREMITY MMT:  pain present with everything and only tested Rt side due to positional limitations and high level of pain  MMT Right eval Left eval  Hip flexion 2/5   Hip extension    Hip abduction 2/5   Hip adduction 4/5   Hip internal rotation    Hip external rotation    Knee flexion    Knee extension    Ankle dorsiflexion    Ankle plantarflexion    Ankle inversion    Ankle eversion     (Blank rows = not tested)  LUMBAR SPECIAL TESTS:  Trendelenburg sign: Positive Rt +pain; pain also Rt side when standing on Lt LE  FUNCTIONAL TESTS:  Sit to stand - shifting weight to the left Bed mobility slow and holding breath  GAIT: Distance walked: to and from waiting room to room 4 Assistive device utilized: Single point cane Level of assistance: Modified independence Comments: antalgic on Rt side even with cane - holding cane in left hand  TODAY'S TREATMENT:                                                                                                                               DATE:  04/25/23  Assessed pelvis - pelvis even Hooklying ADD squeeze 5 sec hold x 10 Bridge x 10 feels pull in R hip flexor TA contraction with BKFO x 10 B, with march x 10 B, heel slides x 5 B S/L clam R 2x10 Prone pelvic press 5 x 5 sec, with hip ext 2 x 10 L/R Seated TA contraction x 5, then with hip flexion x 10 ea Standing TA contraction x 5, then with B OH shoulder flexion  04/18/23  Assessed pelvis; R ilia anteriorly rotated MET to correct ant innominate Manual: TPR and STM to R gluteals, QL and piriformis Prone pelvic press 5 x 5 sec, with unilateral knee bend x 5 ea, B knee bend x 5, hip ext x 5 L/R, mule kick x 5 L/R MET again to correct rotation HEP issued     DATE: 04/02/23   Educated on SI loc or other SI belt, exhale with bed mobility to engage core MET to Rt hip - extension 3x20 sec hold - did not increase pain but only able to do with hip in about 60 deg flexion due to pain  PATIENT EDUCATION:  Education details: initial plan of care Person educated: Patient Education method: Explanation Education comprehension: verbalized understanding  HOME EXERCISE PROGRAM: Access Code: [email or web address] URL: [email or web address] Date: 04/25/2023 Prepared by: Raynelle Fanning  Exercises - MET for Ant Rotation  - 2 x daily - 7 x weekly - 1 sets - 3 reps - 10 sec hold - Prone Hip Extension  - 1 x daily - 3-4 x weekly - 1-2 sets - 10 reps - Prone Hip Extension with Bent Knee  - 1 x daily - 3-4 x weekly - 1-2 sets - 10 reps - Supine Transversus Abdominis Bracing with Double Leg Fallout  - 1 x daily - 3-4 x weekly - 1-3 sets - 10 reps - Beginner Knee Fold  - 1 x daily - 3-4 x weekly - 1-3 sets - 10 reps - Supine Transversus Abdominis Bracing with Heel Slide  - 2 x daily - 3-4 x weekly - 1-3 sets - 10 reps - Clamshell (Mirrored)  - 1 x daily - 3-4 x weekly - 1-3 sets - 10 reps  ASSESSMENT:  CLINICAL IMPRESSION: Madi reports significant improvement in pain since last visit. Her pelvis was even today and she tolerated all TE without increased pain. TA activity is challenging, but she demonstrates good form. Chayne continues to demonstrate potential for improvement and would benefit from continued skilled therapy to address impairments.    OBJECTIVE IMPAIRMENTS: decreased coordination, decreased endurance, difficulty walking, decreased ROM, decreased strength, increased muscle spasms, impaired flexibility, impaired tone, postural dysfunction, and pain.   ACTIVITY LIMITATIONS: carrying, lifting, bending, standing, sleeping, transfers, bed mobility, and caring for others  PARTICIPATION LIMITATIONS: cleaning, laundry, interpersonal relationship, community activity, and  occupation  PERSONAL FACTORS: Time since onset of injury/illness/exacerbation and 1 comorbidity: recently post partum  are also affecting patient's functional outcome.   REHAB POTENTIAL: Excellent  CLINICAL DECISION MAKING: Evolving/moderate complexity  EVALUATION COMPLEXITY: Moderate   GOALS: Goals reviewed with patient? Yes  SHORT TERM GOALS: Target date: 05/01/23  Ind with initial HEP Baseline: Goal status: INITIAL  2.  Pt able to walk with even stride and LRAD due to improved pain management and core and hip strength Baseline:  Goal status: INITIAL   LONG TERM GOALS: Target date: 06/25/23  Pt will be independent with advanced HEP to maintain improvements made throughout therapy  Baseline:  Goal status: INITIAL  2.  Pt will report 75% reduction of pain due to improvements in posture, strength, and muscle length  Baseline:  Goal status: INITIAL  3.  Pt will demonstrate 5/5 bil hip strength for improved functional activities such as gait without assistive device and pain free. Baseline:  Goal status: INITIAL  4.  Pt will be able to lift at least 10 lb correctly for 10 reps without pain or uneven weight distribution for return to all functional activities  Baseline:  Goal status: INITIAL   PLAN:  PT FREQUENCY: 1-2x/week  PT DURATION: 12 weeks  PLANNED INTERVENTIONS: Therapeutic exercises, Therapeutic activity, Neuromuscular re-education, Balance training, Gait training, Patient/Family education, Self Care, Joint mobilization, Aquatic Therapy, Dry Needling, Electrical stimulation, Cryotherapy, Moist heat, scar mobilization, Taping, Traction, Biofeedback, Manual therapy, and Re-evaluation.  PLAN FOR NEXT SESSION: Continue to assess pelvis, progress core and hip strength as tolerated. Pt may need pelvic assessment but not currently having bowel or bladder symptoms and is too soon post partum to tell - treating as sciatica for now.  Manual to glutes and lumbar, IFC stim  for pain management possibly, f/u on SI belt, core strength and coordination of movements with exhale  Solon Palm, PT 04/25/2023, 12:34 PM [facility] [address], [address] [place], [place] [address] Phone # [phone] Fax [phone]

## 2023-04-25 ENCOUNTER — Ambulatory Visit: Payer: BC Managed Care – PPO | Admitting: Physical Therapy

## 2023-04-25 ENCOUNTER — Encounter: Payer: Self-pay | Admitting: Physical Therapy

## 2023-04-25 DIAGNOSIS — M62838 Other muscle spasm: Secondary | ICD-10-CM

## 2023-04-25 DIAGNOSIS — R293 Abnormal posture: Secondary | ICD-10-CM

## 2023-04-25 DIAGNOSIS — M6281 Muscle weakness (generalized): Secondary | ICD-10-CM | POA: Diagnosis not present

## 2023-04-30 ENCOUNTER — Ambulatory Visit: Payer: BC Managed Care – PPO | Admitting: Physical Therapy

## 2023-05-08 ENCOUNTER — Ambulatory Visit: Payer: BC Managed Care – PPO

## 2023-05-08 DIAGNOSIS — M6281 Muscle weakness (generalized): Secondary | ICD-10-CM

## 2023-05-08 DIAGNOSIS — M62838 Other muscle spasm: Secondary | ICD-10-CM

## 2023-05-08 DIAGNOSIS — R293 Abnormal posture: Secondary | ICD-10-CM

## 2023-05-08 NOTE — Therapy (Signed)
OUTPATIENT PHYSICAL THERAPY THORACOLUMBAR TREATMENT   Patient Name: Natalie May MRN: [id number] DOB:31-Mar-1996, 27 y.o., female Today's Date: 05/08/2023  END OF SESSION:  PT End of Session - 05/08/23 1527     Visit Number 4    Date for PT Re-Evaluation 06/25/23    Authorization Type BCBS    PT Start Time 1447    PT Stop Time 1519    PT Time Calculation (min) 32 min    Activity Tolerance Patient tolerated treatment well    Behavior During Therapy [facility] for tasks assessed/performed                Past Medical History:  Diagnosis Date   Medical history non-contributory    Past Surgical History:  Procedure Laterality Date   FINGER SURGERY     Patient Active Problem List   Diagnosis Date Noted   Pre-eclampsia, mild 03/30/2023   Rh negative state in antepartum period 03/30/2023   Indication for care in labor or delivery 03/29/2023   BMI 45.0-49.9, adult (HCC) 10/22/2022   Alpha thalassemia silent carrier 10/22/2022   SMA carrier increased risk 10/22/2022   Rubella non-immune status, antepartum 09/28/2022   Prediabetes 09/28/2022   Supervision of high risk pregnancy, antepartum 08/28/2022   Maternal morbid obesity, antepartum (HCC) 06/22/2021   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    PCP: general  REFERRING PROVIDER: [place] Bing, MD   REFERRING DIAG:  O09.90 (ICD-10-CM) - Supervision of high risk pregnancy, antepartum  M54.31 (ICD-10-CM) - Right sciatic nerve pain    Rationale for Evaluation and Treatment: Rehabilitation  THERAPY DIAG:  Muscle weakness (generalized)  Abnormal posture  Other muscle spasm  ONSET DATE: April 2024  SUBJECTIVE:                                                                                                                                                                                           SUBJECTIVE STATEMENT: I am having Rt knee with bending the knee, going up and  down steps.  PERTINENT HISTORY:  Post partum 03/29/23 and was vaginal delivery  PAIN:  PAIN:  Are you having pain? Yes NPRS scale: 3/10 Pain location: knee  Pain orientation: Right  PAIN TYPE: sharp Pain description: intermittent  Aggravating factors: putting weight to walk, going up steps, bending the knee Relieving factors: Ibuprofen, ice pack  PRECAUTIONS: None  WEIGHT BEARING RESTRICTIONS: No  FALLS:  Has patient fallen in last 6 months? No  LIVING ENVIRONMENT: Lives with: lives with their spouse and infant Lives in: House/apartment   OCCUPATION: maternity leave currently  PLOF: Independent  PATIENT GOALS: get rid of the pain  NEXT MD VISIT: 6 week follow up  OBJECTIVE:   DIAGNOSTIC FINDINGS:    PATIENT SURVEYS:    SCREENING FOR RED FLAGS: Bowel or bladder incontinence: No Spinal tumors: No Cauda equina syndrome: No Compression fracture: No Abdominal aneurysm: No  COGNITION: Overall cognitive status: Within functional limits for tasks assessed     SENSATION: WFL  MUSCLE LENGTH: Hamstrings: Right ~40 deg +pain in buttock; Left ~60 deg +pain in Rt SI    POSTURE: increased lumbar lordosis, anterior pelvic tilt, right pelvic obliquity, and weight shift left  PALPATION: Tight and TTP lumbar Rt gluteals and around greater trochanter and inguinal ligament on Rt; Tight and tender to palpation Rt adductors - very tender at attachment to pelvis  Core - low muscle tone and uses valsalva  Rt anterior pelvic rotation Pelvic compression in sidelying provides some support  LUMBAR ROM:   AROM eval  Flexion 50%  Extension   Right lateral flexion   Left lateral flexion   Right rotation   Left rotation    (Blank rows = not tested)  LOWER EXTREMITY ROM:     Passive  Right eval Left eval  Hip flexion 25% +P   Hip extension    Hip abduction 25% +P   Hip adduction 25% +P   Hip internal rotation 25% +P   Hip external rotation 25% +P   Knee  flexion    Knee extension    Ankle dorsiflexion    Ankle plantarflexion    Ankle inversion    Ankle eversion     (Blank rows = not tested)  LOWER EXTREMITY MMT:  pain present with everything and only tested Rt side due to positional limitations and high level of pain  MMT Right eval Left eval  Hip flexion 2/5   Hip extension    Hip abduction 2/5   Hip adduction 4/5   Hip internal rotation    Hip external rotation    Knee flexion    Knee extension    Ankle dorsiflexion    Ankle plantarflexion    Ankle inversion    Ankle eversion     (Blank rows = not tested)  LUMBAR SPECIAL TESTS:  Trendelenburg sign: Positive Rt +pain; pain also Rt side when standing on Lt LE  FUNCTIONAL TESTS:  Sit to stand - shifting weight to the left Bed mobility slow and holding breath  GAIT: Distance walked: to and from waiting room to room 4 Assistive device utilized: Single point cane Level of assistance: Modified independence Comments: antalgic on Rt side even with cane - holding cane in left hand  TODAY'S TREATMENT:                                                                                                                               DATE:  05/08/23 Assessed pelvis - pelvis even Seated hamstring stretch: 3x20 seconds  Long arc quads:  5" hold x10 Supine quad sets: 5" hold x 10 Heel slides x10 Manual: Addaday to Rt medial quad and medial knee 04/25/23 Assessed pelvis - pelvis even Hooklying ADD squeeze 5 sec hold x 10 Bridge x 10 feels pull in R hip flexor TA contraction with BKFO x 10 B, with march x 10 B, heel slides x 5 B S/L clam R 2x10 Prone pelvic press 5 x 5 sec, with hip ext 2 x 10 L/R Seated TA contraction x 5, then with hip flexion x 10 ea Standing TA contraction x 5, then with B OH shoulder flexion  04/18/23  Assessed pelvis; R ilia anteriorly rotated MET to correct ant innominate Manual: TPR and STM to R gluteals, QL and piriformis Prone pelvic press 5 x 5 sec,  with unilateral knee bend x 5 ea, B knee bend x 5, hip ext x 5 L/R, mule kick x 5 L/R MET again to correct rotation HEP issued   DATE: 04/02/23  Educated on SI loc or other SI belt, exhale with bed mobility to engage core MET to Rt hip - extension 3x20 sec hold - did not increase pain but only able to do with hip in about 60 deg flexion due to pain  PATIENT EDUCATION:  Education details: initial plan of care Person educated: Patient Education method: Explanation Education comprehension: verbalized understanding  HOME EXERCISE PROGRAM: Access Code: [email or web address] URL: [email or web address] Date: 05/08/2023 Prepared by: Tresa Endo  Exercises - MET for Ant Rotation  - 2 x daily - 7 x weekly - 1 sets - 3 reps - 10 sec hold - Prone Hip Extension  - 1 x daily - 3-4 x weekly - 1-2 sets - 10 reps - Prone Hip Extension with Bent Knee  - 1 x daily - 3-4 x weekly - 1-2 sets - 10 reps - Supine Transversus Abdominis Bracing with Double Leg Fallout  - 1 x daily - 3-4 x weekly - 1-3 sets - 10 reps - Beginner Knee Fold  - 1 x daily - 3-4 x weekly - 1-3 sets - 10 reps - Supine Transversus Abdominis Bracing with Heel Slide  - 2 x daily - 3-4 x weekly - 1-3 sets - 10 reps - Clamshell (Mirrored)  - 1 x daily - 3-4 x weekly - 1-3 sets - 10 reps - Seated Hamstring Stretch  - 2 x daily - 7 x weekly - 1 sets - 3 reps - 20 hold - Seated Long Arc Quad  - 2 x daily - 7 x weekly - 1 sets - 10 reps - 5 hold - Sit to Stand Without Arm Support  - 2 x daily - 7 x weekly - 2 sets - 10 reps - Supine Heel Slide  - 2 x daily - 7 x weekly - 1 sets - 5 reps - 5-10 hold  ASSESSMENT:  CLINICAL IMPRESSION: Pt reports that original symptoms have resolved by 85%.  She now has Rt knee pain that is limiting bending the knee and negotiating steps.  PT added HEP for knee symptoms and discussed use of massage gun at home due to positive response in the clinic.  Shortened session as pt needed to feed her baby. Natalie May  continues to demonstrate potential for improvement and would benefit from continued skilled therapy to address impairments.    OBJECTIVE IMPAIRMENTS: decreased coordination, decreased endurance, difficulty walking, decreased ROM, decreased strength, increased muscle spasms, impaired flexibility, impaired tone, postural dysfunction, and pain.   ACTIVITY LIMITATIONS: carrying, lifting, bending,  standing, sleeping, transfers, bed mobility, and caring for others  PARTICIPATION LIMITATIONS: cleaning, laundry, interpersonal relationship, community activity, and occupation  PERSONAL FACTORS: Time since onset of injury/illness/exacerbation and 1 comorbidity: recently post partum  are also affecting patient's functional outcome.   REHAB POTENTIAL: Excellent  CLINICAL DECISION MAKING: Evolving/moderate complexity  EVALUATION COMPLEXITY: Moderate   GOALS: Goals reviewed with patient? Yes  SHORT TERM GOALS: Target date: 05/01/23  Ind with initial HEP Baseline: Goal status:  MET  2.  Pt able to walk with even stride and LRAD due to improved pain management and core and hip strength Baseline:  Goal status: MET   LONG TERM GOALS: Target date: 06/25/23  Pt will be independent with advanced HEP to maintain improvements made throughout therapy  Baseline:  Goal status: INITIAL  2.  Pt will report 75% reduction of pain due to improvements in posture, strength, and muscle length  Baseline:  Goal status: INITIAL  3.  Pt will demonstrate 5/5 bil hip strength for improved functional activities such as gait without assistive device and pain free. Baseline:  Goal status: INITIAL  4.  Pt will be able to lift at least 10 lb correctly for 10 reps without pain or uneven weight distribution for return to all functional activities  Baseline:  Goal status: INITIAL   PLAN:  PT FREQUENCY: 1-2x/week  PT DURATION: 12 weeks  PLANNED INTERVENTIONS: Therapeutic exercises, Therapeutic activity,  Neuromuscular re-education, Balance training, Gait training, Patient/Family education, Self Care, Joint mobilization, Aquatic Therapy, Dry Needling, Electrical stimulation, Cryotherapy, Moist heat, scar mobilization, Taping, Traction, Biofeedback, Manual therapy, and Re-evaluation.  PLAN FOR NEXT SESSION: Continue to assess pelvis, progress core and hip strength as tolerated. Pt may need pelvic assessment but not currently having bowel or bladder symptoms and is too soon post partum to tell - treating as sciatica for now.  Address Rt knee and leg pain as needed.   Lorrene Reid, PT 05/08/23 3:28 PM  [facility] [address] [place], [place] [address] Phone # [phone] Fax [phone]

## 2023-05-13 ENCOUNTER — Ambulatory Visit (INDEPENDENT_AMBULATORY_CARE_PROVIDER_SITE_OTHER): Payer: BC Managed Care – PPO | Admitting: Obstetrics & Gynecology

## 2023-05-13 ENCOUNTER — Encounter: Payer: Self-pay | Admitting: Obstetrics & Gynecology

## 2023-05-13 DIAGNOSIS — F53 Postpartum depression: Secondary | ICD-10-CM | POA: Diagnosis not present

## 2023-05-13 NOTE — Patient Instructions (Signed)
Natalie May is a virtual mental health platform available to our patients   We can refer you to a local mental health provider or you can refer yourself to this online platform using the link below  [email or web address]

## 2023-05-13 NOTE — Progress Notes (Signed)
Post Partum Visit Note  Natalie May is a 27 y.o. G37P1001 female who presents for a postpartum visit. She is 6 weeks postpartum following a normal spontaneous vaginal delivery.  I have fully reviewed the prenatal and intrapartum course, patient had GHTN which has resolved. The delivery was at 39 gestational weeks.  Anesthesia: epidural. Postpartum course has been uncomplicated. Baby is doing well. Baby is feeding by bottle - Similac Prototal . Bleeding no bleeding. Bowel function is normal. Bladder function is normal. Patient is not sexually active. Contraception method is none. Postpartum depression screening: positive.EPDS =12  The pregnancy intention screening data noted above was reviewed. Potential methods of contraception were discussed. The patient elected to proceed with no method.   Edinburgh Postnatal Depression Scale - 05/13/23 1421       Edinburgh Postnatal Depression Scale:  In the Past 7 Days   I have been able to laugh and see the funny side of things. 0    I have looked forward with enjoyment to things. 0    I have blamed myself unnecessarily when things went wrong. 2    I have been anxious or worried for no good reason. 3    I have felt scared or panicky for no good reason. 2    Things have been getting on top of me. 2    I have been so unhappy that I have had difficulty sleeping. 1    I have felt sad or miserable. 1    I have been so unhappy that I have been crying. 1    The thought of harming myself has occurred to me. 0    Edinburgh Postnatal Depression Scale Total 12             Health Maintenance Due  Topic Date Due   DTaP/Tdap/Td (1 - Tdap) Never done   COVID-19 Vaccine (1 - 2023-24 season) Never done   INFLUENZA VACCINE  05/09/2023    The following portions of the patient's history were reviewed and updated as appropriate: allergies, current medications, past family history, past medical history, past social history, past surgical history, and  problem list.  Review of Systems Pertinent items noted in HPI and remainder of comprehensive ROS otherwise negative.  Objective:  BP 124/85   Pulse 85   Wt 295 lb (133.8 kg)   LMP 06/28/2022   Breastfeeding No   BMI 47.61 kg/m    General:  alert and no distress   Breasts:  not indicated  Lungs: clear to auscultation bilaterally  Heart:  regular rate and rhythm  Abdomen: soft, non-tender; bowel sounds normal; no masses,  no organomegaly   GU exam:  normal, well-healed lacerations. Done in presence of chaperone.       Assessment:   1. Postpartum depression - Amb ref to Integrated Behavioral Health  2. Postpartum care following vaginal delivery  Plan:   Essential components of care per ACOG recommendations:  1.  Mood and well being: Patient with positive depression screening today. Reviewed local resources for support.  - Patient tobacco use? No.   - hx of drug use? No.    2. Infant care and feeding:  -Patient currently breastmilk feeding? No.  -Social determinants of health (SDOH) reviewed in EPIC. No concerns.  3. Sexuality, contraception and birth spacing - Patient does not want a pregnancy in the next year.   - Reviewed reproductive life planning. Reviewed contraceptive methods based on pt preferences and effectiveness.  Patient desired No  Method - Other Reason today.  Recommended at least fertility awareness methods and condoms.  - Discussed birth spacing of 18 months  4. Sleep and fatigue -Encouraged family/partner/community support of 4 hrs of uninterrupted sleep to help with mood and fatigue  5. Physical Recovery  - Discussed patients delivery and complications. She describes her labor as mixed. - Patient had a Vaginal, no problems at delivery. Patient had a 2nd degree, vaginal and left sulcula lacerations. Perineal healing reviewed. Patient expressed understanding - Patient has urinary incontinence? No. - Patient is safe to resume physical and sexual  activity  6.  Health Maintenance - HM due items addressed Yes - Last pap smear  Diagnosis  Date Value Ref Range Status  06/21/2021   Final   - Negative for intraepithelial lesion or malignancy (NILM)   Pap smear not done at today's visit.    Jaynie Collins, MD Center for [employer], [facility]

## 2023-05-15 ENCOUNTER — Telehealth: Payer: Self-pay | Admitting: Clinical

## 2023-05-15 ENCOUNTER — Ambulatory Visit: Payer: BC Managed Care – PPO

## 2023-05-15 NOTE — Telephone Encounter (Signed)
Attempt call regarding referral; Left HIPPA-compliant message to call back Jarrad Mclees from [facility] at  [phone] (Katielynn Horan's office).    

## 2023-05-20 ENCOUNTER — Telehealth: Payer: Self-pay | Admitting: Clinical

## 2023-05-20 NOTE — Telephone Encounter (Signed)
Second Attempt call regarding referral; Left HIPPA-compliant message to call back Asher Muir from [employer] for [employer] at [facility] at  [phone] [facility] office).

## 2023-07-04 ENCOUNTER — Telehealth: Payer: Self-pay | Admitting: Family Medicine

## 2023-07-04 NOTE — Telephone Encounter (Signed)
Called patient with no answer, calling to get patient scheduled for an appointment

## 2023-07-08 NOTE — BH Specialist Note (Signed)
Integrated Behavioral Health via Telemedicine Visit  07/22/2023 Nadiah Corbit [id number]  Number of Integrated Behavioral Health Clinician visits: 1- Initial Visit  Session Start time: 0848  Session End time: 0945  Total time in minutes: 57   Referring Provider: Jaynie Collins, MD Patient/Family location: Home [facility] Provider location: Center for [facility] at [facility]  All persons participating in visit: [name] and [facility] Tully Mcinturff   Types of Service: Individual psychotherapy and Video visit  I connected with [employer] and/or [employer]  n/a  via  Telephone or Engineer, civil (consulting)  (Video is Surveyor, mining) and verified that I am speaking with the correct person using two identifiers. Discussed confidentiality: Yes   I discussed the limitations of telemedicine and the availability of in person appointments.  Discussed there is a possibility of technology failure and discussed alternative modes of communication if that failure occurs.  I discussed that engaging in this telemedicine visit, they consent to the provision of behavioral healthcare and the services will be billed under their insurance.  Patient and/or legal guardian expressed understanding and consented to Telemedicine visit: Yes   Presenting Concerns: Patient and/or family reports the following symptoms/concerns: Increased anxiety with panic postpartum; attributes to adjusting to new motherhood /difficulty asking for help when needed; mom and husband have been supportive. Pt is feeling overwhelmed and anxious adjusting to being back at work; last panic attack was about one month ago; uses calming music to cope with anxiety; open to implementing additional self-coping strategy, along with medication for as needed if panic returns.  Duration of problem: Increase postpartum; Severity of problem: moderate  Patient and/or Family's  Strengths/Protective Factors: Social connections, Concrete supports in place (healthy food, safe environments, etc.), and Sense of purpose  Goals Addressed: Patient will:  Reduce symptoms of: anxiety and stress   Increase knowledge and/or ability of: self-management skills and stress reduction   Demonstrate ability to: Increase healthy adjustment to current life circumstances, Increase adequate support systems for patient/family, and Increase motivation to adhere to plan of care  Progress towards Goals: Ongoing  Interventions: Interventions utilized:  Mindfulness or Management consultant, Psychoeducation and/or Health Education, and Link to [employer] Standardized Assessments completed: GAD-7 and PHQ 9  Patient and/or Family Response: Patient agrees with treatment plan.  Assessment: Patient currently experiencing Generalized anxiety disorder with panic attacks  Patient may benefit from psychoeducation and brief therapeutic interventions regarding coping with symptoms of anxiety, depression, life stress .  Plan: Follow up with behavioral health clinician on : Two weeks Behavioral recommendations:  -Continue using self-coping strategy that has remained helpful (calming music, etc.) daily as needed CALM relaxation breathing exercise twice daily (morning; at bedtime with sleep sounds); as needed throughout the day. -Continue to consider accepting help from loved ones, as well as additional work options for greater work-life balance -Consider new mom support group as needed at either [email or web address] or [email or web address]  -Establish with PCP of choice  Referral(s): Integrated Art gallery manager (In Clinic) and [employer]:  new mom support  I discussed the assessment and treatment plan with the patient and/or parent/guardian. They were provided an opportunity to ask questions and all were answered. They agreed with the plan and demonstrated an  understanding of the instructions.   They were advised to call back or seek an in-person evaluation if the symptoms worsen or if the condition fails to improve as anticipated.  Rae Lips, LCSW     07/22/2023  9:07 AM 09/25/2022    3:36 PM  Depression screen PHQ 2/9  Decreased Interest 0 1  Down, Depressed, Hopeless 1 0  PHQ - 2 Score 1 1  Altered sleeping 1 1  Tired, decreased energy 1 2  Change in appetite 1 2  Feeling bad or failure about yourself  1 0  Trouble concentrating 1 1  Moving slowly or fidgety/restless 0 0  Suicidal thoughts 0 0  PHQ-9 Score 6 7  Difficult doing work/chores  Very difficult      07/22/2023    9:09 AM 09/25/2022    3:36 PM  GAD 7 : Generalized Anxiety Score  Nervous, Anxious, on Edge 3 0  Control/stop worrying 3 1  Worry too much - different things 1 1  Trouble relaxing 1 0  Restless 0   Easily annoyed or irritable 1 2  Afraid - awful might happen 1 1  Total GAD 7 Score 10   Anxiety Difficulty  Somewhat difficult      05/13/2023    2:21 PM 03/31/2023   12:54 PM  Edinburgh Postnatal Depression Scale Screening Tool  I have been able to laugh and see the funny side of things. 0 0  I have looked forward with enjoyment to things. 0 0  I have blamed myself unnecessarily when things went wrong. 2 0  I have been anxious or worried for no good reason. 3 2  I have felt scared or panicky for no good reason. 2 0  Things have been getting on top of me. 2 2  I have been so unhappy that I have had difficulty sleeping. 1 0  I have felt sad or miserable. 1 1  I have been so unhappy that I have been crying. 1 0  The thought of harming myself has occurred to me. 0 0  Edinburgh Postnatal Depression Scale Total 12 5

## 2023-07-22 ENCOUNTER — Other Ambulatory Visit: Payer: Self-pay | Admitting: Obstetrics & Gynecology

## 2023-07-22 ENCOUNTER — Ambulatory Visit (INDEPENDENT_AMBULATORY_CARE_PROVIDER_SITE_OTHER): Payer: Self-pay | Admitting: Clinical

## 2023-07-22 ENCOUNTER — Encounter: Payer: Self-pay | Admitting: Obstetrics & Gynecology

## 2023-07-22 DIAGNOSIS — F410 Panic disorder [episodic paroxysmal anxiety] without agoraphobia: Secondary | ICD-10-CM

## 2023-07-22 DIAGNOSIS — F411 Generalized anxiety disorder: Secondary | ICD-10-CM

## 2023-07-22 MED ORDER — HYDROXYZINE HCL 25 MG PO TABS
25.0000 mg | ORAL_TABLET | Freq: Three times a day (TID) | ORAL | 0 refills | Status: DC | PRN
Start: 2023-07-22 — End: 2023-11-20

## 2023-07-22 NOTE — Progress Notes (Signed)
Atarax prescribed for patient as per [facility] recommendations for her anxiety attacks for now.  She is in process of being followed up by mental health provider.  Jaynie Collins, MD

## 2023-07-22 NOTE — Patient Instructions (Signed)
Center for [facility] Healthcare at [facility] for Women [address] [place], [place] [address] [phone] (main office) [phone] [facility] office)  New Parent Support Groups [email or web address]

## 2023-07-23 NOTE — BH Specialist Note (Addendum)
Integrated Behavioral Health via Telemedicine Visit  08/06/2023 Natalie May [id number]  Number of Integrated Behavioral Health Clinician visits: 2- Second Visit  Session Start time: 1449   Session End time: 1530  Total time in minutes: 41   Referring Provider: Jaynie Collins, MD Patient/Family location: Home [facility] Provider location: [facility] for [facility]  All persons participating in visit: [name] and Natalie May Natalie May   Types of Service: Individual psychotherapy and Video visit  I connected with [employer] and/or [employer]  n/a  via  Telephone or Engineer, civil (consulting)  (Video is Surveyor, mining) and verified that I am speaking with the correct person using two identifiers. Discussed confidentiality: Yes   I discussed the limitations of telemedicine and the availability of in person appointments.  Discussed there is a possibility of technology failure and discussed alternative modes of communication if that failure occurs.  I discussed that engaging in this telemedicine visit, they consent to the provision of behavioral healthcare and the services will be billed under their insurance.  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  Duration of problem: Ongoing with recent increase; Severity of problem:  moderately severe  Patient and/or Family's Strengths/Protective Factors: Social connections, Concrete supports in  place (healthy food, safe environments, etc.), and Sense of purpose  Goals Addressed: Patient will:  Reduce symptoms of: anxiety and stress   Increase knowledge and/or ability of: stress reduction   Demonstrate ability to: Increase healthy adjustment to current life circumstances and Increase motivation to adhere to plan of care  Progress towards Goals: Ongoing  Interventions: Interventions utilized:  Supportive Reflection Standardized Assessments completed: Not Needed  Patient and/or Family Response: Patient agrees with treatment plan.   Assessment: Patient currently experiencing Grief; Generalized anxiety disorder with panic attacks; Psychosocial stress.   Patient may benefit from continued therapeutic intervention  .  Plan: Follow up with behavioral health clinician on : Three weeks Behavioral recommendations:  -Begin taking Atarax as needed -Continue using self-coping strategies daily as needed (calming music, relaxation breathing, etc) -Continue plan to put together uncle's obituary this week, as your contribution, without having to travel with baby -Continue plan to get together with friend getting married after the honeymoon, instead of attending as originally planned -Continue self-reminders that this job is temporary; continue job-seeking daily until work from home position is offered and accepted -Continue to consider new mom support group as discussed (as needed) Referral(s): Integrated [employer] (In Clinic)  I discussed the assessment and treatment plan with the patient and/or parent/guardian. They were provided an opportunity to ask questions and all were answered. They agreed with the plan and demonstrated an understanding of the instructions.   They were advised to call back or seek an in-person evaluation if the symptoms worsen or if the condition fails to improve as anticipated.  Rae Lips, LCSW     07/22/2023    9:07 AM 09/25/2022     3:36 PM  Depression screen PHQ 2/9  Decreased Interest 0 1  Down, Depressed, Hopeless 1 0  PHQ - 2 Score 1 1  Altered sleeping 1 1  Tired, decreased energy 1 2  Change in appetite 1 2  Feeling bad or failure about yourself  1 0  Trouble concentrating 1 1  Moving slowly or fidgety/restless 0 0  Suicidal thoughts 0 0  PHQ-9 Score 6 7  Difficult doing work/chores  Very difficult      07/22/2023    9:09 AM 09/25/2022    3:36 PM  GAD 7 : Generalized Anxiety Score  Nervous, Anxious, on Edge 3 0  Control/stop worrying 3 1  Worry too much - different things 1 1  Trouble relaxing 1 0  Restless 0   Easily annoyed or irritable 1 2  Afraid - awful might happen 1 1  Total GAD 7 Score 10   Anxiety Difficulty  Somewhat difficult

## 2023-08-06 ENCOUNTER — Ambulatory Visit (INDEPENDENT_AMBULATORY_CARE_PROVIDER_SITE_OTHER): Payer: Self-pay | Admitting: Clinical

## 2023-08-06 DIAGNOSIS — F410 Panic disorder [episodic paroxysmal anxiety] without agoraphobia: Secondary | ICD-10-CM

## 2023-08-06 DIAGNOSIS — Z658 Other specified problems related to psychosocial circumstances: Secondary | ICD-10-CM

## 2023-08-06 DIAGNOSIS — F4321 Adjustment disorder with depressed mood: Secondary | ICD-10-CM

## 2023-08-20 NOTE — BH Specialist Note (Unsigned)
Integrated Behavioral Health via Telemedicine Visit  09/03/2023 [name] [id number]  Number of Integrated Behavioral Health Clinician visits: 3- Third Visit  Session Start time: 1415   Session End time: 1456  Total time in minutes: 41   Referring Provider: Jaynie Collins, MD Patient/Family location: Home  [facility] Provider location: Center for [facility] at [facility]  All persons participating in visit: [name] and [facility] Natalie May    Types of Service: Individual psychotherapy and Video visit  I connected with [employer] and/or [employer]  n/a  via  Telephone or Engineer, civil (consulting)  (Video is Surveyor, mining) and verified that I am speaking with the correct person using two identifiers. Discussed confidentiality: Yes   I discussed the limitations of telemedicine and the availability of in person appointments.  Discussed there is a possibility of technology failure and discussed alternative modes of communication if that failure occurs.  I discussed that engaging in this telemedicine visit, they consent to the provision of behavioral healthcare and the services will be billed under their insurance.  Patient and/or legal guardian expressed understanding and consented to Telemedicine visit: Yes   Presenting Concerns: Patient and/or family reports the following symptoms/concerns: Took Atarax once, made her feel high; uncertain she will take again; will continue using self-coping strategies. Pt experiencing ongoing work stress; waiting to hear back about a potential new job; looking forward to upcoming holiday time with family.  Duration of problem: Ongoing Severity of problem: moderate  Patient and/or Family's Strengths/Protective Factors: Social connections, Concrete supports in place (healthy food, safe environments, etc.), and Sense of purpose  Goals Addressed: Patient will:  Reduce  symptoms of: anxiety and stress   Increase knowledge and/or ability of: stress reduction   Demonstrate ability to: Increase healthy adjustment to current life circumstances  Progress towards Goals: Ongoing  Interventions: Interventions utilized:  Solution-Focused Strategies Standardized Assessments completed: GAD-7 and PHQ 9  Patient and/or Family Response: Patient agrees with treatment plan.   Assessment: Patient currently experiencing Generalized anxiety disorder with panic; Psychosocial stress.   Patient may benefit from continued therapeutic intervention  .  Plan: Follow up with behavioral health clinician on : Call Martavious Hartel at [phone], as needed. Behavioral recommendations:  -Continue taking Atarax only as needed -Continue using self-coping strategies to manage increased stress and related anxiety -Continue plan to contact previous interview location regarding possible job -Continue plan to take much-needed break from work over upcoming holiday time; time with loved ones Referral(s): Integrated [employer] (In Clinic)  I discussed the assessment and treatment plan with the patient and/or parent/guardian. They were provided an opportunity to ask questions and all were answered. They agreed with the plan and demonstrated an understanding of the instructions.   They were advised to call back or seek an in-person evaluation if the symptoms worsen or if the condition fails to improve as anticipated.  [name] [name], LCSW     09/03/2023    2:37 PM 07/22/2023    9:07 AM 09/25/2022    3:36 PM  Depression screen PHQ 2/9  Decreased Interest 1 0 1  Down, Depressed, Hopeless 1 1 0  PHQ - 2 Score 2 1 1   Altered sleeping 3 1 1   Tired, decreased energy 3 1 2   Change in appetite 3 1 2   Feeling bad or failure about yourself  1 1 0  Trouble concentrating 0 1 1  Moving slowly or fidgety/restless 0 0 0  Suicidal  thoughts 0 0 0  PHQ-9 Score 12 6 7   Difficult  doing work/chores   Very difficult      09/03/2023    2:42 PM 07/22/2023    9:09 AM 09/25/2022    3:36 PM  GAD 7 : Generalized Anxiety Score  Nervous, Anxious, on Edge 1 3 0  Control/stop worrying 3 3 1   Worry too much - different things 1 1 1   Trouble relaxing 0 1 0  Restless 1 0   Easily annoyed or irritable 1 1 2   Afraid - awful might happen 1 1 1   Total GAD 7 Score 8 10   Anxiety Difficulty   Somewhat difficult

## 2023-09-03 ENCOUNTER — Ambulatory Visit: Payer: Self-pay | Admitting: Clinical

## 2023-09-03 DIAGNOSIS — F411 Generalized anxiety disorder: Secondary | ICD-10-CM

## 2023-09-03 DIAGNOSIS — Z658 Other specified problems related to psychosocial circumstances: Secondary | ICD-10-CM

## 2023-09-03 DIAGNOSIS — F410 Panic disorder [episodic paroxysmal anxiety] without agoraphobia: Secondary | ICD-10-CM

## 2023-09-03 NOTE — Patient Instructions (Signed)
Center for [facility] Healthcare at [facility] for Women [address] [place], [place] [address] [phone] (main office) [phone] (Istvan Behar's office)

## 2023-11-20 ENCOUNTER — Encounter: Payer: Self-pay | Admitting: Family Medicine

## 2023-11-20 ENCOUNTER — Ambulatory Visit: Payer: BC Managed Care – PPO | Admitting: Family Medicine

## 2023-11-20 VITALS — BP 128/76 | HR 100 | Ht 66.0 in | Wt 334.0 lb

## 2023-11-20 DIAGNOSIS — N63 Unspecified lump in unspecified breast: Secondary | ICD-10-CM | POA: Diagnosis not present

## 2023-11-20 DIAGNOSIS — I10 Essential (primary) hypertension: Secondary | ICD-10-CM

## 2023-11-20 DIAGNOSIS — G5603 Carpal tunnel syndrome, bilateral upper limbs: Secondary | ICD-10-CM | POA: Diagnosis not present

## 2023-11-20 MED ORDER — GABAPENTIN 100 MG PO CAPS
100.0000 mg | ORAL_CAPSULE | Freq: Three times a day (TID) | ORAL | 1 refills | Status: AC
Start: 2023-11-20 — End: ?

## 2023-11-20 NOTE — Patient Instructions (Signed)
VISIT SUMMARY:  Today, you came in for your first visit with Natalie May, accompanied by your daughter. We discussed several health concerns, including numbness and tingling in your hands, a lump in your right breast, and your blood pressure. We also reviewed your general health maintenance needs.  YOUR PLAN:  -CARPAL TUNNEL SYNDROME: Carpal tunnel syndrome is a condition where the median nerve in the wrist is compressed, causing numbness, tingling, and weakness in the hand. We recommend using wrist braces at night and possibly during the day. You have been prescribed gabapentin 100 mg at bedtime, with instructions to increase to 200 mg and then 300 mg at bedtime if needed. We have also referred you to neurology for nerve conduction studies.  -BREAST LUMP: You have a sore lump in your right breast that has been present for about a month. This could be due to several reasons, including fatty necrosis, mastitis, or folliculitis. Given your family history of breast cancer, we have ordered a diagnostic mammogram and ultrasound for both breasts to get a clearer understanding of the lump.  -HYPERTENSION: Your blood pressure reading today was 136/90 mmHg, which is slightly elevated. Given your history of preeclampsia, it is important to monitor your blood pressure closely. We will recheck your blood pressure in one month.  -GENERAL HEALTH MAINTENANCE: We discussed the importance of keeping your vaccinations up to date. Unfortunately, we were unable to administer the tetanus vaccine today due to limited supply, but we will do so when it becomes available.  INSTRUCTIONS:  Please follow up in one month for a blood pressure recheck. Schedule your diagnostic mammogram and ultrasound as soon as possible. Additionally, follow up with neurology for nerve conduction studies.

## 2023-11-20 NOTE — Assessment & Plan Note (Signed)
Sore, hyperpigmented lump at the 3 o'clock position on the right breast for one month. Differential diagnosis includes fatty necrosis, mastitis, or folliculitis. Family history of breast cancer in great grandmother. Discussed need for diagnostic mammogram and ultrasound, including imaging of the armpit areas. - Order diagnostic mammogram and ultrasound for both breasts

## 2023-11-20 NOTE — Assessment & Plan Note (Signed)
Chronic  Blood pressure reading of 136/90 mmHg. History of preeclampsia during pregnancy. No current antihypertensive.  medications. Discussed importance of monitoring blood pressure and rechecking in one month. Improved to 128/76 - Recheck blood pressure in one month

## 2023-11-20 NOTE — Progress Notes (Signed)
New patient visit   Patient: Natalie May   DOB: [birth year]   27 y.o. Female  MRN: [id number] Visit Date: 11/20/2023  Today's healthcare provider: Ronnald Ramp, MD   Chief Complaint  Patient presents with   Establish Care    Discuss numbness and tingling in all of her fingers, at times it shoots up her arms, this has been going on since she had her daughter about 8 months ago.    Cyst    She has a knot on her R breast, its painful and has a bruise around it, its been there for a couple weeks, pain scale 2   Health Maintenance    Agreed to have tdap vaccine    Subjective    Natalie May is a 28 y.o. female who presents today as a new patient to establish care.   HPI     Establish Care    Additional comments: Discuss numbness and tingling in all of her fingers, at times it shoots up her arms, this has been going on since she had her daughter about 8 months ago.         Cyst    Additional comments: She has a knot on her R breast, its painful and has a bruise around it, its been there for a couple weeks, pain scale 2        Health Maintenance    Additional comments: Agreed to have tdap vaccine       Last edited by Acey Lav, CMA on 11/20/2023  3:57 PM.       Discussed the use of AI scribe software for clinical note transcription with the patient, who gave verbal consent to proceed.  History of Present Illness   Natalie May is a 28 year old female who presents to establish care as a new patient. She is accompanied by her daughter, Kandace Blitz.  She has been experiencing numbness and tingling in both hands since the birth of her child eight months ago. The sensation is described as her hands 'falling asleep' and can occur at any time of day during various activities, such as driving or holding her baby. The numbness affects all fingers and sometimes radiates up her arms. She notes weakened grip strength, making it difficult to open jars. There  is a history of a surgical procedure on her pinky finger as a child, which doctors indicated might lead to rheumatoid arthritis. No neck pain is reported.  She mentions a knot on her right breast that has been present for about a month. The area was previously bruised and is currently sore to touch. She is not actively breastfeeding but did attempt to express milk when her child had sniffles. Her great-grandmother had breast cancer, diagnosed at an older age.  Her past medical history includes being a silent carrier of alpha thalassemia, experiencing preeclampsia during her pregnancy, and having borderline prediabetes. She was prescribed Lasix for three days in the past and hydroxyzine, which she discontinued due to adverse effects. She currently takes ibuprofen as needed and occasionally uses Imium gummies. She is concerned about her family history of diabetes on both sides.      Past Medical History:  Diagnosis Date   Alpha thalassemia silent carrier 10/22/2022   Preeclampsia    SMA carrier increased risk 10/22/2022    Outpatient Medications Prior to Visit  Medication Sig   ibuprofen (ADVIL) 600 MG tablet Take 1 tablet (600 mg total) by mouth every 6 (six) hours as needed.   [  DISCONTINUED] cyclobenzaprine (FLEXERIL) 10 MG tablet Take 1 tablet (10 mg total) by mouth 3 (three) times daily as needed for muscle spasms. (Patient not taking: Reported on 11/20/2023)   [DISCONTINUED] furosemide (LASIX) 20 MG tablet Take 1 tablet (20 mg total) by mouth daily for 3 days.   [DISCONTINUED] hydrOXYzine (ATARAX) 25 MG tablet Take 1 tablet (25 mg total) by mouth 3 (three) times daily as needed. (Patient not taking: Reported on 11/20/2023)   [DISCONTINUED] Prenatal Vit-Fe Fumarate-FA (PRENATAL MULTIVITAMIN) TABS tablet Take 1 tablet by mouth daily at 12 noon. (Patient not taking: Reported on 11/20/2023)   No facility-administered medications prior to visit.    Past Surgical History:  Procedure Laterality  Date   FINGER SURGERY     Family Status  Relation Name Status   Mother  Alive   Father  Alive   Mat Aunt  (Not Specified)   MGM  (Not Specified)   MGF  (Not Specified)   Maternal GGM  Alive  No partnership data on file   Family History  Problem Relation Age of Onset   Asthma Mother    Asthma Maternal Aunt    Hypertension Maternal Grandmother    Diabetes Maternal Grandmother    Stroke Maternal Grandfather    Breast cancer Maternal Great-grandmother    Social History   Socioeconomic History   Marital status: Married    Spouse name: Not on file   Number of children: Not on file   Years of education: Not on file   Highest education level: Master's degree (e.g., MA, MS, MEng, MEd, MSW, MBA)  Occupational History   Not on file  Tobacco Use   Smoking status: Never   Smokeless tobacco: Never  Vaping Use   Vaping status: Never Used  Substance and Sexual Activity   Alcohol use: Yes   Drug use: Never   Sexual activity: Yes    Birth control/protection: None  Other Topics Concern   Not on file  Social History Narrative   Not on file   Social Drivers of Health   Financial Resource Strain: Medium Risk (11/16/2023)   Overall Financial Resource Strain (CARDIA)    Difficulty of Paying Living Expenses: Somewhat hard  Food Insecurity: Food Insecurity Present (11/16/2023)   Hunger Vital Sign    Worried About Running Out of Food in the Last Year: Sometimes true    Ran Out of Food in the Last Year: Patient declined  Transportation Needs: No Transportation Needs (11/16/2023)   PRAPARE - Administrator, Civil Service (Medical): No    Lack of Transportation (Non-Medical): No  Physical Activity: Unknown (11/16/2023)   Exercise Vital Sign    Days of Exercise per Week: 0 days    Minutes of Exercise per Session: Not on file  Stress: Stress Concern Present (11/16/2023)   [employer] of Occupational Health - Occupational Stress Questionnaire    Feeling of Stress : To some  extent  Social Connections: Moderately Isolated (11/16/2023)   Social Connection and Isolation Panel [NHANES]    Frequency of Communication with Friends and Family: More than three times a week    Frequency of Social Gatherings with Friends and Family: Never    Attends Religious Services: Never    Database administrator or Organizations: No    Attends Engineer, structural: Not on file    Marital Status: Married     No Known Allergies  Immunization History  Administered Date(s) Administered   Influenza,inj,Quad PF,6+ Mos  09/25/2022   MMR 03/31/2023    Health Maintenance  Topic Date Due   DTaP/Tdap/Td (1 - Tdap) Never done   INFLUENZA VACCINE  05/09/2023   COVID-19 Vaccine (1 - 2024-25 season) Never done   Cervical Cancer Screening (Pap smear)  06/21/2024   Hepatitis C Screening  Completed   HIV Screening  Completed   HPV VACCINES  Aged Out    Patient Care Team: Ronnald Ramp, MD as PCP - General (Family Medicine)  Review of Systems  Last CBC Lab Results  Component Value Date   WBC 8.8 03/29/2023   HGB 11.1 (L) 03/29/2023   HCT 35.1 (L) 03/29/2023   MCV 78.0 (L) 03/29/2023   MCH 24.7 (L) 03/29/2023   RDW 15.8 (H) 03/29/2023   PLT 317 03/29/2023   Last metabolic panel Lab Results  Component Value Date   GLUCOSE 90 03/29/2023   NA 137 03/29/2023   K 4.0 03/29/2023   CL 108 03/29/2023   CO2 19 (L) 03/29/2023   BUN 5 (L) 03/29/2023   CREATININE 0.70 03/29/2023   GFRNONAA >60 03/29/2023   CALCIUM 9.2 03/29/2023   PROT 7.0 03/29/2023   ALBUMIN 2.5 (L) 03/29/2023   LABGLOB 3.1 11/30/2021   AGRATIO 1.3 11/30/2021   BILITOT 0.2 (L) 03/29/2023   ALKPHOS 216 (H) 03/29/2023   AST 18 03/29/2023   ALT 15 03/29/2023   ANIONGAP 10 03/29/2023   Last lipids Lab Results  Component Value Date   CHOL 212 (H) 11/30/2021   HDL 57 11/30/2021   LDLCALC 141 (H) 11/30/2021   TRIG 76 11/30/2021   CHOLHDL 3.7 11/30/2021   Last hemoglobin A1c Lab  Results  Component Value Date   HGBA1C 5.7 (H) 09/25/2022   Last thyroid functions Lab Results  Component Value Date   TSH 0.786 11/30/2021   Last vitamin D Lab Results  Component Value Date   VD25OH 22.9 (L) 11/30/2021   Last vitamin B12 and Folate No results found for: "VITAMINB12", "FOLATE"      Objective    BP 128/76 (Cuff Size: Large)   Pulse 100   Ht 5\' 6"  (1.676 m)   Wt (!) 334 lb (151.5 kg)   SpO2 98%   BMI 53.91 kg/m  BP Readings from Last 3 Encounters:  11/20/23 128/76  05/13/23 124/85  04/08/23 122/83   Wt Readings from Last 3 Encounters:  11/20/23 (!) 334 lb (151.5 kg)  05/13/23 295 lb (133.8 kg)  03/26/23 (!) 312 lb (141.5 kg)        Depression Screen    11/20/2023    3:52 PM 09/03/2023    2:37 PM 07/22/2023    9:07 AM 09/25/2022    3:36 PM  PHQ 2/9 Scores  PHQ - 2 Score 1 2 1 1   PHQ- 9 Score 8 12 6 7    No results found for any visits on 11/20/23.   Physical Exam Physical Exam   VITALS: BP- 136/90 BREAST: Right breast at 3 o'clock position, hyperpigmented area with palpable mass, tender to palpation.     MSK: bilateral + tinels tests and +phalens tests, normal radial and ulnar pulses, decreased grip strength in left hand     Assessment & Plan      Problem List Items Addressed This Visit       Cardiovascular and Mediastinum   HTN (hypertension)   Chronic  Blood pressure reading of 136/90 mmHg. History of preeclampsia during pregnancy. No current antihypertensive.  medications. Discussed importance of monitoring blood  pressure and rechecking in one month. Improved to 128/76 - Recheck blood pressure in one month        Nervous and Auditory   Bilateral carpal tunnel syndrome   Numbness and tingling in bilateral hands since giving birth eight months ago, with loss of sensation in fingers, radiating up the arms, and weakness in grip, particularly on the left side. Symptoms are consistent with carpal tunnel syndrome, possibly  exacerbated by fluid retention during pregnancy. Discussed the use of wrist braces and gabapentin for nerve pain, noting potential drowsiness and gradual dose increase. Referred to neurology for nerve conduction studies. - Recommend carpal tunnel wrist braces at night and possibly during the day - Prescribe gabapentin 100 mg at bedtime, with instructions to increase to 200 mg and then 300 mg at bedtime if needed - Refer to neurology for nerve conduction studies      Relevant Medications   gabapentin (NEURONTIN) 100 MG capsule   Other Relevant Orders   Ambulatory referral to Neurology     Other   Breast lump in upper inner quadrant - Primary   Sore, hyperpigmented lump at the 3 o'clock position on the right breast for one month. Differential diagnosis includes fatty necrosis, mastitis, or folliculitis. Family history of breast cancer in great grandmother. Discussed need for diagnostic mammogram and ultrasound, including imaging of the armpit areas. - Order diagnostic mammogram and ultrasound for both breasts      Relevant Orders   MM 3D DIAGNOSTIC MAMMOGRAM UNILATERAL RIGHT BREAST   MM 3D DIAGNOSTIC MAMMOGRAM UNILATERAL LEFT BREAST   [place] LIMITED ULTRASOUND INCLUDING AXILLA LEFT BREAST    [place] LIMITED ULTRASOUND INCLUDING AXILLA RIGHT BREAST       General Health Maintenance Unable to administer tetanus vaccine due to limited supply. Discussed importance of updating vaccinations. - Administer tetanus vaccine when available   Return in about 1 month (around 12/18/2023) for HTN.      Ronnald Ramp, MD  [facility] [phone] (phone) [phone] (fax)  [facility]

## 2023-11-20 NOTE — Assessment & Plan Note (Signed)
Numbness and tingling in bilateral hands since giving birth eight months ago, with loss of sensation in fingers, radiating up the arms, and weakness in grip, particularly on the left side. Symptoms are consistent with carpal tunnel syndrome, possibly exacerbated by fluid retention during pregnancy. Discussed the use of wrist braces and gabapentin for nerve pain, noting potential drowsiness and gradual dose increase. Referred to neurology for nerve conduction studies. - Recommend carpal tunnel wrist braces at night and possibly during the day - Prescribe gabapentin 100 mg at bedtime, with instructions to increase to 200 mg and then 300 mg at bedtime if needed - Refer to neurology for nerve conduction studies

## 2023-11-25 ENCOUNTER — Other Ambulatory Visit: Payer: BC Managed Care – PPO

## 2023-12-18 ENCOUNTER — Ambulatory Visit: Payer: BC Managed Care – PPO | Admitting: Family Medicine

## 2023-12-18 ENCOUNTER — Ambulatory Visit
Admission: RE | Admit: 2023-12-18 | Discharge: 2023-12-18 | Disposition: A | Discharge status: Home / Self Care | Payer: BC Managed Care – PPO | Source: Ambulatory Visit | Attending: Family Medicine | Admitting: Family Medicine

## 2023-12-18 ENCOUNTER — Encounter: Payer: Self-pay | Admitting: Family Medicine

## 2023-12-18 VITALS — BP 113/71 | HR 99 | Ht 66.0 in | Wt 329.0 lb

## 2023-12-18 DIAGNOSIS — N63 Unspecified lump in unspecified breast: Secondary | ICD-10-CM | POA: Insufficient documentation

## 2023-12-18 DIAGNOSIS — I10 Essential (primary) hypertension: Secondary | ICD-10-CM | POA: Diagnosis not present

## 2023-12-18 DIAGNOSIS — G5603 Carpal tunnel syndrome, bilateral upper limbs: Secondary | ICD-10-CM

## 2023-12-18 MED ORDER — DULOXETINE HCL 30 MG PO CPEP: ORAL_CAPSULE | ORAL | 3 refills | Status: DC

## 2023-12-18 NOTE — Assessment & Plan Note (Signed)
 benign  Ultrasound of the right breast showed two benign intradermal fluid collections, likely related to prior trauma or benign dermal mass such as an epidermal inclusion cyst. No evidence of malignancy was found. She was informed that if the cysts worsen or become larger, surgical removal may be necessary.  She was advised to return for repeat breast imaging if there are any changes or concerns.

## 2023-12-18 NOTE — Assessment & Plan Note (Signed)
 resolved, normal BP Hypertension is well-controlled with a blood pressure reading of 113/71 mmHg. No antihypertensive medications are currently being used due to well-controlled blood pressure.

## 2023-12-18 NOTE — Patient Instructions (Signed)
 [phone]

## 2023-12-18 NOTE — Progress Notes (Signed)
 Established patient visit   Patient: Natalie May   DOB: [birth year]   28 y.o. Female  MRN: [id number] Visit Date: 12/18/2023  Today's healthcare provider: Ronnald Ramp, MD   Chief Complaint  Patient presents with   Hypertension    Bp recheck    Subjective     HPI     Hypertension    Additional comments: Bp recheck       Last edited by Thedora Hinders, CMA on 12/18/2023  3:17 PM.       Discussed the use of AI scribe software for clinical note transcription with the patient, who gave verbal consent to proceed.  History of Present Illness   The patient is a 28 year old with hypertension who presents for follow-up. She is accompanied by her baby.  Her hypertension is currently well-controlled with a blood pressure reading of 113/71 mmHg. She is not on any current medications for hypertension.  She has a history of a cyst on her right breast. An ultrasound performed earlier today showed two benign intradermal fluid collections, possibly related to prior trauma or a benign dermal mass like an epidermal inclusion cyst. There is no evidence of malignancy. She recalls a past incident where a child bit her on the chest, which might be related to the current findings. The area was tender and sore during the ultrasound, but she has not experienced any recent trauma.  She has been experiencing symptoms suggestive of carpal tunnel syndrome and was referred to neurology for nerve studies. However, she has not yet been contacted for scheduling. She was previously prescribed gabapentin 100 mg at bedtime, which made her drowsy, and she has not been taking it regularly. The symptoms are still present but not as severe as before.  She is a busy mom with a baby who is 29 old and actively developing, including standing, cruising, and having four teeth. She feels tired, which she attributes to her busy lifestyle. No recent falls or significant trauma.          Past Medical History:  Diagnosis Date   Alpha thalassemia silent carrier 10/22/2022   Preeclampsia    SMA carrier increased risk 10/22/2022    Medications: Outpatient Medications Prior to Visit  Medication Sig   gabapentin (NEURONTIN) 100 MG capsule Take 1 capsule (100 mg total) by mouth 3 (three) times daily.   ibuprofen (ADVIL) 600 MG tablet Take 1 tablet (600 mg total) by mouth every 6 (six) hours as needed.   No facility-administered medications prior to visit.    Review of Systems  Last metabolic panel Lab Results  Component Value Date   GLUCOSE 90 03/29/2023   NA 137 03/29/2023   K 4.0 03/29/2023   CL 108 03/29/2023   CO2 19 (L) 03/29/2023   BUN 5 (L) 03/29/2023   CREATININE 0.70 03/29/2023   GFRNONAA >60 03/29/2023   CALCIUM 9.2 03/29/2023   PROT 7.0 03/29/2023   ALBUMIN 2.5 (L) 03/29/2023   LABGLOB 3.1 11/30/2021   AGRATIO 1.3 11/30/2021   BILITOT 0.2 (L) 03/29/2023   ALKPHOS 216 (H) 03/29/2023   AST 18 03/29/2023   ALT 15 03/29/2023   ANIONGAP 10 03/29/2023   Last hemoglobin A1c Lab Results  Component Value Date   HGBA1C 5.7 (H) 09/25/2022   Last thyroid functions Lab Results  Component Value Date   TSH 0.786 11/30/2021        Objective    BP 113/71   Pulse 99  Ht 5\' 6"  (1.676 m)   Wt (!) 329 lb (149.2 kg)   SpO2 99%   BMI 53.10 kg/m  BP Readings from Last 3 Encounters:  12/18/23 113/71  11/20/23 128/76  05/13/23 124/85   Wt Readings from Last 3 Encounters:  12/18/23 (!) 329 lb (149.2 kg)  11/20/23 (!) 334 lb (151.5 kg)  05/13/23 295 lb (133.8 kg)        Physical Exam  General: Alert, no acute distress Cardio: Normal S1 and S2, RRR, no r/m/g Pulm: CTAB, normal work of breathing   No results found for any visits on 12/18/23.  Assessment & Plan     Problem List Items Addressed This Visit       Cardiovascular and Mediastinum   HTN (hypertension) - Primary   resolved, normal BP Hypertension is well-controlled  with a blood pressure reading of 113/71 mmHg. No antihypertensive medications are currently being used due to well-controlled blood pressure.        Nervous and Auditory   Bilateral carpal tunnel syndrome   Chronic  She experiences nerve pain, suspected to be carpal tunnel syndrome. Gabapentin was initially prescribed but caused drowsiness and was ineffective. Duloxetine is being considered as an alternative to manage nerve pain. Referral to neurology for nerve studies is pending. - Referred to neurology for nerve studies. - Prescribe duloxetine 30 mg capsules, instruct to take 30 mg for one week, then increase to 60 mg. - Provide neurology clinic contact information for her to follow up on the referral.      Relevant Medications   DULoxetine (CYMBALTA) 30 MG capsule     Other   Breast lump in upper inner quadrant   benign  Ultrasound of the right breast showed two benign intradermal fluid collections, likely related to prior trauma or benign dermal mass such as an epidermal inclusion cyst. No evidence of malignancy was found. She was informed that if the cysts worsen or become larger, surgical removal may be necessary.  She was advised to return for repeat breast imaging if there are any changes or concerns.            General Health Maintenance She will begin breast cancer screening with mammograms at age 29.      No follow-ups on file.         Ronnald Ramp, MD  [facility] [phone] (phone) [phone] (fax)  [facility]

## 2023-12-18 NOTE — Assessment & Plan Note (Signed)
 Chronic  She experiences nerve pain, suspected to be carpal tunnel syndrome. Gabapentin was initially prescribed but caused drowsiness and was ineffective. Duloxetine is being considered as an alternative to manage nerve pain. Referral to neurology for nerve studies is pending. - Referred to neurology for nerve studies. - Prescribe duloxetine 30 mg capsules, instruct to take 30 mg for one week, then increase to 60 mg. - Provide neurology clinic contact information for her to follow up on the referral.

## 2024-01-09 ENCOUNTER — Other Ambulatory Visit: Payer: Self-pay | Admitting: Family Medicine

## 2024-12-09 ENCOUNTER — Encounter: Admitting: Family Medicine
